# Patient Record
Sex: Male | Born: 1970 | Race: Black or African American | Hispanic: No | Marital: Married | Smoking: Current every day smoker
Health system: Southern US, Community
[De-identification: ages and names within clinical notes are randomized; demographics above are authoritative.]

## PROBLEM LIST (undated history)

## (undated) DIAGNOSIS — I509 Heart failure, unspecified: Secondary | ICD-10-CM

## (undated) DIAGNOSIS — I10 Essential (primary) hypertension: Secondary | ICD-10-CM

## (undated) DIAGNOSIS — T8859XA Other complications of anesthesia, initial encounter: Secondary | ICD-10-CM

## (undated) HISTORY — PX: CARDIAC CATHETERIZATION: SHX172

---

## 2004-06-21 ENCOUNTER — Emergency Department: Payer: Self-pay | Admitting: Emergency Medicine

## 2005-01-06 ENCOUNTER — Other Ambulatory Visit: Payer: Self-pay

## 2005-01-06 ENCOUNTER — Emergency Department: Payer: Self-pay | Admitting: Emergency Medicine

## 2007-05-24 ENCOUNTER — Emergency Department: Payer: Self-pay | Admitting: Emergency Medicine

## 2009-08-19 ENCOUNTER — Emergency Department: Payer: Self-pay | Admitting: Emergency Medicine

## 2010-07-01 ENCOUNTER — Emergency Department: Payer: Self-pay | Admitting: Emergency Medicine

## 2010-08-28 ENCOUNTER — Emergency Department: Payer: Self-pay | Admitting: Emergency Medicine

## 2011-01-16 ENCOUNTER — Emergency Department: Payer: Self-pay | Admitting: *Deleted

## 2011-06-18 ENCOUNTER — Emergency Department: Payer: Self-pay | Admitting: Internal Medicine

## 2011-06-18 LAB — CBC
HGB: 14.8 g/dL (ref 13.0–18.0)
MCH: 30.5 pg (ref 26.0–34.0)
MCHC: 33.8 g/dL (ref 32.0–36.0)
MCV: 90 fL (ref 80–100)
Platelet: 308 10*3/uL (ref 150–440)
RBC: 4.86 10*6/uL (ref 4.40–5.90)
WBC: 14.8 10*3/uL — ABNORMAL HIGH (ref 3.8–10.6)

## 2011-06-18 LAB — COMPREHENSIVE METABOLIC PANEL
Albumin: 3.4 g/dL (ref 3.4–5.0)
Alkaline Phosphatase: 110 U/L (ref 50–136)
Anion Gap: 10 (ref 7–16)
BUN: 13 mg/dL (ref 7–18)
Bilirubin,Total: 0.4 mg/dL (ref 0.2–1.0)
Calcium, Total: 9.1 mg/dL (ref 8.5–10.1)
Chloride: 101 mmol/L (ref 98–107)
Co2: 29 mmol/L (ref 21–32)
Creatinine: 1.19 mg/dL (ref 0.60–1.30)
EGFR (African American): >60
Glucose: 99 mg/dL (ref 65–99)
Osmolality: 280 (ref 275–301)
Potassium: 3.5 mmol/L (ref 3.5–5.1)
Total Protein: 8.5 g/dL — ABNORMAL HIGH (ref 6.4–8.2)

## 2011-06-18 LAB — TROPONIN I: Troponin-I: 0.05 ng/mL

## 2013-03-19 ENCOUNTER — Emergency Department: Payer: Self-pay | Admitting: Emergency Medicine

## 2013-04-24 ENCOUNTER — Emergency Department: Payer: Self-pay | Admitting: Emergency Medicine

## 2013-04-28 ENCOUNTER — Emergency Department: Payer: Self-pay | Admitting: Emergency Medicine

## 2013-05-28 ENCOUNTER — Emergency Department: Payer: Self-pay | Admitting: Emergency Medicine

## 2015-10-26 ENCOUNTER — Emergency Department: Payer: Self-pay

## 2015-10-26 ENCOUNTER — Encounter: Payer: Self-pay | Admitting: Emergency Medicine

## 2015-10-26 ENCOUNTER — Emergency Department
Admission: EM | Admit: 2015-10-26 | Discharge: 2015-10-26 | Disposition: A | Discharge status: Home / Self Care | Payer: Self-pay | Attending: Emergency Medicine | Admitting: Emergency Medicine

## 2015-10-26 DIAGNOSIS — J209 Acute bronchitis, unspecified: Secondary | ICD-10-CM | POA: Insufficient documentation

## 2015-10-26 DIAGNOSIS — F17210 Nicotine dependence, cigarettes, uncomplicated: Secondary | ICD-10-CM | POA: Insufficient documentation

## 2015-10-26 MED ORDER — PREDNISONE 10 MG PO TABS: 50.0000 mg | ORAL_TABLET | Freq: Every day | ORAL | Status: DC

## 2015-10-26 MED ORDER — AZITHROMYCIN 250 MG PO TABS: ORAL_TABLET | ORAL | Status: DC

## 2015-10-26 MED ORDER — IPRATROPIUM-ALBUTEROL 0.5-2.5 (3) MG/3ML IN SOLN
3.0000 mL | Freq: Once | RESPIRATORY_TRACT | Status: AC
  Administered 2015-10-26: 3 mL via RESPIRATORY_TRACT
  Filled 2015-10-26: qty 3

## 2015-10-26 MED ORDER — HYDROCOD POLST-CPM POLST ER 10-8 MG/5ML PO SUER
5.0000 mL | Freq: Once | ORAL | Status: AC
  Administered 2015-10-26: 5 mL via ORAL
  Filled 2015-10-26: qty 5

## 2015-10-26 MED ORDER — PREDNISONE 20 MG PO TABS
60.0000 mg | ORAL_TABLET | Freq: Once | ORAL | Status: AC
  Administered 2015-10-26: 60 mg via ORAL
  Filled 2015-10-26: qty 3

## 2015-10-26 MED ORDER — ALBUTEROL SULFATE HFA 108 (90 BASE) MCG/ACT IN AERS: 2.0000 | INHALATION_SPRAY | Freq: Four times a day (QID) | RESPIRATORY_TRACT | Status: DC | PRN

## 2015-10-26 MED ORDER — GUAIFENESIN-CODEINE 100-10 MG/5ML PO SYRP: 5.0000 mL | ORAL_SOLUTION | Freq: Three times a day (TID) | ORAL | Status: DC | PRN

## 2015-10-26 NOTE — ED Provider Notes (Signed)
[facility] Emergency Department Provider Note  ____________________________________________  Time seen: Approximately 9:11 PM  I have reviewed the triage vital signs and the nursing notes.   HISTORY  Chief Complaint Cough   HPI Michael Ross is a 45 y.o. male who presents to the emergency department for evaluation of cough and chest tightness. No fever or other symptoms.Started approximately 4 days ago. Wheezing and rattling have gotten worse. Cough is preventing sleep. No relief with Alka-Seltzer cold medicine.   History reviewed. No pertinent past medical history.  There are no active problems to display for this patient.   History reviewed. No pertinent past surgical history.  Current Outpatient Rx  Name  Route  Sig  Dispense  Refill  . albuterol (PROVENTIL HFA;VENTOLIN HFA) 108 (90 Base) MCG/ACT inhaler   Inhalation   Inhale 2 puffs into the lungs every 6 (six) hours as needed for wheezing or shortness of breath.   1 Inhaler   2   . azithromycin (ZITHROMAX) 250 MG tablet      2 tablets today, then 1 tablet for the next 4 days.   6 each   0   . guaiFENesin-codeine (ROBITUSSIN AC) 100-10 MG/5ML syrup   Oral   Take 5 mLs by mouth 3 (three) times daily as needed for cough.   120 mL   0   . predniSONE (DELTASONE) 10 MG tablet   Oral   Take 5 tablets (50 mg total) by mouth daily.   25 tablet   0     Allergies Review of patient's allergies indicates no known allergies.  History reviewed. No pertinent family history.  Social History Social History  Substance Use Topics  . Smoking status: Current Every Day Smoker -- 0.50 packs/day    Types: Cigarettes  . Smokeless tobacco: None  . Alcohol Use: None    Review of Systems Constitutional: Negative for fever/chills ENT: Negative for sore throat. Cardiovascular: Denies chest pain. Respiratory: Occasional shortness of breath. Positive for cough. Gastrointestinal: Negative for  nausea,  no vomiting.  No diarrhea.  Musculoskeletal: Give for body aches Skin: Negative for rash. Neurological: Negative for headaches ____________________________________________   PHYSICAL EXAM:  VITAL SIGNS: ED Triage Vitals  Enc Vitals Group     BP 10/26/15 2016 154/98 mmHg     Pulse Rate 10/26/15 2016 97     Resp 10/26/15 2016 18     Temp 10/26/15 2016 98.8 F (37.1 C)     Temp Source 10/26/15 2016 Oral     SpO2 10/26/15 2016 95 %     Weight 10/26/15 2016 250 lb (113.399 kg)     Height 10/26/15 2016 5\' 5"  (1.651 m)     Head Cir --      Peak Flow --      Pain Score 10/26/15 2103 7     Pain Loc --      Pain Edu? --      Excl. in [place]? --     Constitutional: Alert and oriented. Well appearing and in no acute distress. Eyes: Conjunctivae are normal. EOMI. Ears: Bilateral TM normal. Nose: No congestion; no rhinnorhea. Mouth/Throat: Mucous membranes are moist.  Oropharynx mildly erythematous. Tonsils appear normal without exudate. Neck: No stridor.  Lymphatic: No cervical lymphadenopathy. Cardiovascular: Normal rate, regular rhythm. Grossly normal heart sounds.  Good peripheral circulation. Respiratory: Normal respiratory effort.  No retractions. Faint expiratory wheeze noted in the left lower lung field, otherwise clear. Gastrointestinal: Soft and nontender.  Musculoskeletal: FROM x  4 extremities.  Neurologic:  Normal speech and language.  Skin:  Skin is warm, dry and intact. No rash noted. Psychiatric: Mood and affect are normal. Speech and behavior are normal.  ____________________________________________   LABS (all labs ordered are listed, but only abnormal results are displayed)  Labs Reviewed - No data to display ____________________________________________  EKG   ____________________________________________  RADIOLOGY  Chest x-ray negative for acute abnormality per radiology. ____________________________________________   PROCEDURES  Procedure(s)  performed: None  Critical Care performed: No  ____________________________________________   INITIAL IMPRESSION / ASSESSMENT AND PLAN / ED COURSE  Pertinent labs & imaging results that were available during my care of the patient were reviewed by me and considered in my medical decision making (see chart for details).   Patient was given a DuoNeb treatment and prednisone while in the emergency department. Prescriptions for azithromycin, prednisone, albuterol inhaler, and Robitussin-AC will be given at discharge. The patient was encouraged to schedule a follow-up appointment with primary care provider of his choice for symptoms that are not improving over the next 4-5 days. He was encouraged to return to the emergency department for symptoms that change or worsen if he is unable schedule an appointment. ____________________________________________   FINAL CLINICAL IMPRESSION(S) / ED DIAGNOSES  Final diagnoses:  Bronchitis, acute, with bronchospasm       Victorino Dike, FNP 10/26/15 2155  Hinda Kehr, MD 10/26/15 2314

## 2015-10-26 NOTE — Discharge Instructions (Signed)

## 2015-10-26 NOTE — ED Notes (Signed)
Pt presents to ED with cough x3 days, SOB with when coughing. No fever.

## 2015-10-26 NOTE — ED Notes (Addendum)
Pt. C/o cough and chest tightness. Denies fever, sore throat, sinus s/s. Pt. Presenting with chest congestion and cough. Home tx with alkaseltzer.

## 2015-11-14 ENCOUNTER — Emergency Department
Admission: EM | Admit: 2015-11-14 | Discharge: 2015-11-14 | Disposition: A | Discharge status: Home / Self Care | Payer: Self-pay | Attending: Emergency Medicine | Admitting: Emergency Medicine

## 2015-11-14 ENCOUNTER — Encounter: Payer: Self-pay | Admitting: *Deleted

## 2015-11-14 ENCOUNTER — Emergency Department: Payer: Self-pay

## 2015-11-14 DIAGNOSIS — Z792 Long term (current) use of antibiotics: Secondary | ICD-10-CM | POA: Insufficient documentation

## 2015-11-14 DIAGNOSIS — J209 Acute bronchitis, unspecified: Secondary | ICD-10-CM | POA: Insufficient documentation

## 2015-11-14 DIAGNOSIS — Z87891 Personal history of nicotine dependence: Secondary | ICD-10-CM | POA: Insufficient documentation

## 2015-11-14 DIAGNOSIS — Z79899 Other long term (current) drug therapy: Secondary | ICD-10-CM | POA: Insufficient documentation

## 2015-11-14 LAB — COMPREHENSIVE METABOLIC PANEL
ALBUMIN: 3.8 g/dL (ref 3.5–5.0)
ALT: 27 U/L (ref 17–63)
ANION GAP: 6 (ref 5–15)
AST: 27 U/L (ref 15–41)
Alkaline Phosphatase: 88 U/L (ref 38–126)
BILIRUBIN TOTAL: 0.5 mg/dL (ref 0.3–1.2)
BUN: 22 mg/dL — AB (ref 6–20)
CO2: 27 mmol/L (ref 22–32)
Calcium: 8.7 mg/dL — ABNORMAL LOW (ref 8.9–10.3)
Chloride: 106 mmol/L (ref 101–111)
Creatinine, Ser: 1.37 mg/dL — ABNORMAL HIGH (ref 0.61–1.24)
GFR calc Af Amer: >60 mL/min (ref >60)
GFR calc non Af Amer: >60 mL/min (ref >60)
GLUCOSE: 116 mg/dL — AB (ref 65–99)
POTASSIUM: 3.4 mmol/L — AB (ref 3.5–5.1)
SODIUM: 139 mmol/L (ref 135–145)
TOTAL PROTEIN: 7.7 g/dL (ref 6.5–8.1)

## 2015-11-14 LAB — CBC
HEMATOCRIT: 41.5 % (ref 40.0–52.0)
HEMOGLOBIN: 13.8 g/dL (ref 13.0–18.0)
MCH: 29.2 pg (ref 26.0–34.0)
MCHC: 33.3 g/dL (ref 32.0–36.0)
MCV: 87.7 fL (ref 80.0–100.0)
Platelets: 252 10*3/uL (ref 150–440)
RBC: 4.74 MIL/uL (ref 4.40–5.90)
RDW: 14.1 % (ref 11.5–14.5)
WBC: 16.8 10*3/uL — AB (ref 3.8–10.6)

## 2015-11-14 LAB — TROPONIN I
Troponin I: 0.04 ng/mL (ref <0.03)
Troponin I: 0.03 ng/mL (ref <0.03)

## 2015-11-14 MED ORDER — HYDROCOD POLST-CPM POLST ER 10-8 MG/5ML PO SUER
5.0000 mL | Freq: Once | ORAL | Status: AC
  Administered 2015-11-14: 5 mL via ORAL
  Filled 2015-11-14: qty 5

## 2015-11-14 MED ORDER — HYDROCHLOROTHIAZIDE 12.5 MG PO CAPS: 12.5000 mg | ORAL_CAPSULE | Freq: Every day | ORAL | Status: DC

## 2015-11-14 MED ORDER — ALBUTEROL SULFATE (2.5 MG/3ML) 0.083% IN NEBU
5.0000 mg | INHALATION_SOLUTION | Freq: Once | RESPIRATORY_TRACT | Status: AC
  Administered 2015-11-14: 5 mg via RESPIRATORY_TRACT
  Filled 2015-11-14: qty 6

## 2015-11-14 MED ORDER — ALBUTEROL SULFATE (2.5 MG/3ML) 0.083% IN NEBU
2.5000 mg | INHALATION_SOLUTION | Freq: Once | RESPIRATORY_TRACT | Status: AC
  Administered 2015-11-14: 2.5 mg via RESPIRATORY_TRACT
  Filled 2015-11-14: qty 3

## 2015-11-14 MED ORDER — PREDNISONE 20 MG PO TABS
60.0000 mg | ORAL_TABLET | Freq: Once | ORAL | Status: AC
  Administered 2015-11-14: 60 mg via ORAL
  Filled 2015-11-14: qty 3

## 2015-11-14 MED ORDER — HYDROCOD POLST-CPM POLST ER 10-8 MG/5ML PO SUER: 5.0000 mL | Freq: Two times a day (BID) | ORAL | Status: DC | PRN

## 2015-11-14 MED ORDER — PREDNISONE 10 MG PO TABS: 50.0000 mg | ORAL_TABLET | Freq: Every day | ORAL | Status: AC

## 2015-11-14 NOTE — ED Notes (Signed)
Pt provided with po fluids.

## 2015-11-14 NOTE — ED Notes (Signed)
Critical troponin of 0.04 called from paula in lab. Dr. Owens Shark notified. Pt placed on cardiac monitor, call bell at side. Pt updated on next step and possible repeat troponin draw.

## 2015-11-14 NOTE — ED Notes (Signed)
Report from teresa, rn.  

## 2015-11-14 NOTE — ED Notes (Addendum)
Pt reports he has a cough (dry nonproductive) that is causing chest pain - He states he was here a few weeks ago and got an received nebulizer at the er - He got prednisone and atb but states once he stopped taking the medication the cough returned - Pt reports being sick for over a month

## 2015-11-14 NOTE — ED Provider Notes (Signed)
[facility] Emergency Department Provider Note  ____________________________________________  Time seen: 2:30 AM  I have reviewed the triage vital signs and the nursing notes.   HISTORY  Chief Complaint Cough and Pleurisy      HPI Michael Ross is a 45 y.o. male presents with one-month history of nonproductive cough chest tightness with coughing, wheezing. Patient denies any fever no chills. Of note patient was seen on 10/26/2015 diagnoses acute bronchitis prescribe antibiotics prednisone with improvement of symptoms. Patient states after smoking excessively last week with recurrence of symptoms    Past medical history  bronchitis There are no active problems to display for this patient.   Past surgical history None  Current Outpatient Rx  Name  Route  Sig  Dispense  Refill  . albuterol (PROVENTIL HFA;VENTOLIN HFA) 108 (90 Base) MCG/ACT inhaler   Inhalation   Inhale 2 puffs into the lungs every 6 (six) hours as needed for wheezing or shortness of breath.   1 Inhaler   2   . azithromycin (ZITHROMAX) 250 MG tablet      2 tablets today, then 1 tablet for the next 4 days.   6 each   0   . guaiFENesin-codeine (ROBITUSSIN AC) 100-10 MG/5ML syrup   Oral   Take 5 mLs by mouth 3 (three) times daily as needed for cough.   120 mL   0   . predniSONE (DELTASONE) 10 MG tablet   Oral   Take 5 tablets (50 mg total) by mouth daily.   25 tablet   0     Allergies No known drug allergies No family history on file.  Social History Social History  Substance Use Topics  . Smoking status: Former Smoker -- 0.50 packs/day    Types: Cigarettes    Quit date: 11/09/2015  . Smokeless tobacco: Never Used  . Alcohol Use: Yes     Comment: 1-2 beers twice a week    Review of Systems  Constitutional: Negative for fever. Eyes: Negative for visual changes. ENT: Negative for sore throat. Cardiovascular: Negative for chest pain. Respiratory: Negative  for shortness of breath. Positive for cough and wheezing Gastrointestinal: Negative for abdominal pain, vomiting and diarrhea. Genitourinary: Negative for dysuria. Musculoskeletal: Negative for back pain. Skin: Negative for rash. Neurological: Negative for headaches, focal weakness or numbness.   10-point ROS otherwise negative.  ____________________________________________   PHYSICAL EXAM:  VITAL SIGNS: ED Triage Vitals  Enc Vitals Group     BP 11/14/15 0143 163/104 mmHg     Pulse Rate 11/14/15 0143 96     Resp 11/14/15 0143 28     Temp 11/14/15 0143 98.2 F (36.8 C)     Temp Source 11/14/15 0143 Oral     SpO2 11/14/15 0143 93 %     Weight 11/14/15 0143 245 lb (111.131 kg)     Height 11/14/15 0143 5\' 4"  (1.626 m)     Head Cir --      Peak Flow --      Pain Score 11/14/15 0144 8     Pain Loc --      Pain Edu? --      Excl. in [place]? --      Constitutional: Alert and oriented. Well appearing and in no distress. Eyes: Conjunctivae are normal. PERRL. Normal extraocular movements. ENT   Head: Normocephalic and atraumatic.   Nose: No congestion/rhinnorhea.   Mouth/Throat: Mucous membranes are moist.   Neck: No stridor. Hematological/Lymphatic/Immunilogical: No cervical lymphadenopathy. Cardiovascular: Normal  rate, regular rhythm. Normal and symmetric distal pulses are present in all extremities. No murmurs, rubs, or gallops. Respiratory: Normal respiratory effort without tachypnea nor retractions. Breath sounds are clear and equal bilaterally. No wheezes/rales/rhonchi. Gastrointestinal: Soft and nontender. No distention. There is no CVA tenderness. Genitourinary: deferred Musculoskeletal: Nontender with normal range of motion in all extremities. No joint effusions.  No lower extremity tenderness nor edema. Neurologic:  Normal speech and language. No gross focal neurologic deficits are appreciated. Speech is normal.  Skin:  Skin is warm, dry and intact. No rash  noted. Psychiatric: Mood and affect are normal. Speech and behavior are normal. Patient exhibits appropriate insight and judgment.  ____________________________________________    LABS (pertinent positives/negatives)  Labs Reviewed  CBC - Abnormal; Notable for the following:    WBC 16.8 (*)    All other components within normal limits  COMPREHENSIVE METABOLIC PANEL  TROPONIN I     ____________________________________________   EKG  ED ECG REPORT I, [place] N BROWN, the attending physician, personally viewed and interpreted this ECG.   Date: 11/14/2015  EKG Time: 1:44 AM  Rate: 101  Rhythm: Sinus tachycardia  Axis: Normal  Intervals: Normal  ST&T Change: None     RADIOLOGY  DG Chest 2 View (Final result) Result time: 11/14/15 02:10:15   Final result by Rad Results In Interface (11/14/15 02:10:15)   Narrative:   CLINICAL DATA: Nonproductive cough and non radiating chest pain for 1 month.  EXAM: CHEST 2 VIEW  COMPARISON: 10/26/2015  FINDINGS: Unchanged mild cardiomegaly. The lungs are clear. The pulmonary vasculature is normal. There is no pleural effusion. Hilar and mediastinal contours are unremarkable and unchanged.  IMPRESSION: Stable mild cardiomegaly. No acute findings.   Electronically Signed By: Andreas Newport M.D. On: 11/14/2015 02:10         Procedures    INITIAL IMPRESSION / ASSESSMENT AND PLAN / ED COURSE  Pertinent labs & imaging results that were available during my care of the patient were reviewed by me and considered in my medical decision making (see chart for details).    ____________________________________________   FINAL CLINICAL IMPRESSION(S) / ED DIAGNOSES  Final diagnoses:  Acute bronchitis, unspecified organism      Gregor Hams, MD 11/14/15 [phone]

## 2015-11-14 NOTE — ED Notes (Signed)
Pt c/o persistent, non-productive cough and non-radiating chest pain w/ cough. Pt states he feels as if he is still wheezing. Pt denies n/v and fever at this time. Pt states recently treated for cough.

## 2015-11-14 NOTE — Discharge Instructions (Signed)

## 2015-11-25 ENCOUNTER — Emergency Department
Admission: EM | Admit: 2015-11-25 | Discharge: 2015-11-25 | Disposition: A | Discharge status: Home / Self Care | Payer: Self-pay | Attending: Emergency Medicine | Admitting: Emergency Medicine

## 2015-11-25 ENCOUNTER — Emergency Department: Payer: Self-pay

## 2015-11-25 DIAGNOSIS — F17210 Nicotine dependence, cigarettes, uncomplicated: Secondary | ICD-10-CM | POA: Insufficient documentation

## 2015-11-25 DIAGNOSIS — Z79899 Other long term (current) drug therapy: Secondary | ICD-10-CM | POA: Insufficient documentation

## 2015-11-25 DIAGNOSIS — J42 Unspecified chronic bronchitis: Secondary | ICD-10-CM | POA: Insufficient documentation

## 2015-11-25 DIAGNOSIS — I10 Essential (primary) hypertension: Secondary | ICD-10-CM | POA: Insufficient documentation

## 2015-11-25 HISTORY — DX: Essential (primary) hypertension: I10

## 2015-11-25 LAB — CBC
HCT: 41.1 % (ref 40.0–52.0)
Hemoglobin: 13.9 g/dL (ref 13.0–18.0)
MCH: 29.4 pg (ref 26.0–34.0)
MCHC: 33.7 g/dL (ref 32.0–36.0)
MCV: 87.2 fL (ref 80.0–100.0)
Platelets: 248 10*3/uL (ref 150–440)
RBC: 4.71 MIL/uL (ref 4.40–5.90)
RDW: 14.3 % (ref 11.5–14.5)
WBC: 13.7 10*3/uL — ABNORMAL HIGH (ref 3.8–10.6)

## 2015-11-25 LAB — BASIC METABOLIC PANEL
Anion gap: 8 (ref 5–15)
BUN: 21 mg/dL — ABNORMAL HIGH (ref 6–20)
CALCIUM: 8.9 mg/dL (ref 8.9–10.3)
CO2: 28 mmol/L (ref 22–32)
CREATININE: 1.4 mg/dL — AB (ref 0.61–1.24)
Chloride: 105 mmol/L (ref 101–111)
GLUCOSE: 141 mg/dL — AB (ref 65–99)
Potassium: 3.1 mmol/L — ABNORMAL LOW (ref 3.5–5.1)
Sodium: 141 mmol/L (ref 135–145)

## 2015-11-25 LAB — TROPONIN I: TROPONIN I: 0.04 ng/mL — AB (ref <0.03)

## 2015-11-25 MED ORDER — AZITHROMYCIN 500 MG PO TABS
500.0000 mg | ORAL_TABLET | Freq: Once | ORAL | Status: AC
  Administered 2015-11-25: 500 mg via ORAL
  Filled 2015-11-25: qty 1

## 2015-11-25 MED ORDER — HYDROCOD POLST-CPM POLST ER 10-8 MG/5ML PO SUER: 5.0000 mL | Freq: Two times a day (BID) | ORAL | Status: DC | PRN

## 2015-11-25 MED ORDER — ALBUTEROL SULFATE HFA 108 (90 BASE) MCG/ACT IN AERS: 2.0000 | INHALATION_SPRAY | RESPIRATORY_TRACT | Status: DC | PRN

## 2015-11-25 MED ORDER — ALBUTEROL SULFATE (2.5 MG/3ML) 0.083% IN NEBU
INHALATION_SOLUTION | RESPIRATORY_TRACT | Status: AC
  Administered 2015-11-25: 2.5 mg
  Filled 2015-11-25: qty 3

## 2015-11-25 MED ORDER — AZITHROMYCIN 500 MG PO TABS: 500.0000 mg | ORAL_TABLET | Freq: Every day | ORAL | Status: AC

## 2015-11-25 MED ORDER — PREDNISONE 20 MG PO TABS: 60.0000 mg | ORAL_TABLET | Freq: Every day | ORAL | Status: AC

## 2015-11-25 MED ORDER — METHYLPREDNISOLONE SODIUM SUCC 125 MG IJ SOLR
125.0000 mg | Freq: Once | INTRAMUSCULAR | Status: AC
  Administered 2015-11-25: 125 mg via INTRAVENOUS
  Filled 2015-11-25: qty 2

## 2015-11-25 MED ORDER — ALBUTEROL SULFATE (2.5 MG/3ML) 0.083% IN NEBU
5.0000 mg | INHALATION_SOLUTION | Freq: Once | RESPIRATORY_TRACT | Status: AC
  Administered 2015-11-25: 5 mg via RESPIRATORY_TRACT
  Filled 2015-11-25: qty 6

## 2015-11-25 NOTE — ED Notes (Signed)
Pt came in by EMS, reports that he is starting to smoke again.  O2 was 88% upon arrival of EMS.  Pt received 2 duonebs and albuterol in route.  Pt reports feeling somewhat better, but still visibly tachypneic.  Pt diaphoretic as well.  Pt coughing up yellow sputum.

## 2015-11-25 NOTE — ED Notes (Signed)
Pt breathing not labored at this time.  Pt states he feels better at this time.  O2 back down to 89% without oxygen.

## 2015-11-25 NOTE — ED Provider Notes (Signed)
[facility] Emergency Department Provider Note  ____________________________________________  Time seen: 5:40 AM  I have reviewed the triage vital signs and the nursing notes.   HISTORY  Chief Complaint Shortness of Breath     HPI Michael Ross is a 45 y.o. male presents with productive cough shortness of breath 3 days. Patient denies any fever afebrile on presentation temperature of 98.6. Of note patient was seen on 11/14/2015 for same. He states following this visit he felt much better with prednisone antibiotic therapy and antitussive however he stated that "when I started feeling better I started smoking again". Patient denies any chest pain. Patient received 2 DuoNeb's and EMS and states that his breathing is improved     Past Medical History  Diagnosis Date  . Hypertension     There are no active problems to display for this patient.   History reviewed. No pertinent past surgical history.  Current Outpatient Rx  Name  Route  Sig  Dispense  Refill  . albuterol (PROVENTIL HFA;VENTOLIN HFA) 108 (90 Base) MCG/ACT inhaler   Inhalation   Inhale 2 puffs into the lungs every 6 (six) hours as needed for wheezing or shortness of breath.   1 Inhaler   2   . chlorpheniramine-HYDROcodone (TUSSIONEX) 10-8 MG/5ML SUER   Oral   Take 5 mLs by mouth every 12 (twelve) hours as needed for cough.   140 mL   0   . hydrochlorothiazide (MICROZIDE) 12.5 MG capsule   Oral   Take 1 capsule (12.5 mg total) by mouth daily.   30 capsule   0     Allergies No known drug allergies No family history on file.  Social History Social History  Substance Use Topics  . Smoking status: Current Every Day Smoker -- 0.50 packs/day    Types: Cigarettes    Last Attempt to Quit: 11/09/2015  . Smokeless tobacco: Never Used  . Alcohol Use: Yes     Comment: 1-2 beers twice a week    Review of Systems  Constitutional: Negative for fever. Eyes: Negative for visual  changes. ENT: Negative for sore throat. Cardiovascular: Negative for chest pain. Respiratory: Positive for shortness of breath. Gastrointestinal: Negative for abdominal pain, vomiting and diarrhea. Genitourinary: Negative for dysuria. Musculoskeletal: Negative for back pain. Skin: Negative for rash. Neurological: Negative for headaches, focal weakness or numbness.   10-point ROS otherwise negative.  ____________________________________________   PHYSICAL EXAM:  VITAL SIGNS: ED Triage Vitals  Enc Vitals Group     BP 11/25/15 0540 166/101 mmHg     Pulse Rate 11/25/15 0540 120     Resp 11/25/15 0540 22     Temp 11/25/15 0540 98.6 F (37 C)     Temp Source 11/25/15 0540 Oral     SpO2 11/25/15 0540 88 %     Weight 11/25/15 0540 250 lb (113.399 kg)     Height 11/25/15 0540 5\' 5"  (1.651 m)     Head Cir --      Peak Flow --      Pain Score 11/25/15 0541 8     Pain Loc --      Pain Edu? --      Excl. in [place]? --     Constitutional: Alert and oriented. Apparent respiratory distress Eyes: Conjunctivae are normal. PERRL. Normal extraocular movements. ENT   Head: Normocephalic and atraumatic.   Nose: No congestion/rhinnorhea.   Mouth/Throat: Mucous membranes are moist.   Neck: No stridor. Hematological/Lymphatic/Immunilogical: No cervical lymphadenopathy.  Cardiovascular: Normal rate, regular rhythm. Normal and symmetric distal pulses are present in all extremities. No murmurs, rubs, or gallops. Respiratory: Tachypnea, positive accessory respiratory muscle use diffuse expiratory wheezes Gastrointestinal: Soft and nontender. No distention. There is no CVA tenderness. Genitourinary: deferred Musculoskeletal: Nontender with normal range of motion in all extremities. No joint effusions.  No lower extremity tenderness nor edema. Neurologic:  Normal speech and language. No gross focal neurologic deficits are appreciated. Speech is normal.  Skin:  Skin is warm, dry and intact.  No rash noted. Psychiatric: Mood and affect are normal. Speech and behavior are normal. Patient exhibits appropriate insight and judgment.  ____________________________________________    LABS (pertinent positives/negatives)  Labs Reviewed  BASIC METABOLIC PANEL - Abnormal; Notable for the following:    Potassium 3.1 (*)    Glucose, Bld 141 (*)    BUN 21 (*)    Creatinine, Ser 1.40 (*)    All other components within normal limits  CBC - Abnormal; Notable for the following:    WBC 13.7 (*)    All other components within normal limits  TROPONIN I - Abnormal; Notable for the following:    Troponin I 0.04 (*)    All other components within normal limits     ____________________________________________   EKG  ED ECG REPORT I, [place] N BROWN, the attending physician, personally viewed and interpreted this ECG.   Date: 11/25/2015  EKG Time: 5:36 AM  Rate: 115  Rhythm: Sinus tachycardia  Axis: Normal  Intervals: Normal  ST&T Change: None   ____________________________________________    RADIOLOGY  DG Chest Port 1 View (Final result) Result time: 11/25/15 06:39:30   Final result by Rad Results In Interface (11/25/15 06:39:30)   Narrative:   CLINICAL DATA: SOB and cough. Just finished breathing treatment before portable. SOB x 2 weeks, hasn't gotten better since seen last time. Pt stated no prev surg or any conditions. Shielded.  EXAM: PORTABLE CHEST 1 VIEW  COMPARISON: 11/14/2015  FINDINGS: Borderline heart size without vascular congestion. No edema or consolidation. No blunting of costophrenic angles. No pneumothorax. Mediastinal contours appear intact.  IMPRESSION: No active disease.   Electronically Signed By: Lucienne Capers M.D. On: 11/25/2015 06:39    Procedures     INITIAL IMPRESSION / ASSESSMENT AND PLAN / ED COURSE  Pertinent labs & imaging results that were available during my care of the patient were reviewed by me and  considered in my medical decision making (see chart for details). Patient received albuterol 5 mg nebulized as well as Solu-Medrol 125 mg on presentation to emergency departmentWith movement of symptoms  ____________________________________________   FINAL CLINICAL IMPRESSION(S) / ED DIAGNOSES  Final diagnoses:  Chronic bronchitis, unspecified chronic bronchitis type ([place])      Gregor Hams, MD 11/25/15 [phone]

## 2015-11-25 NOTE — ED Notes (Signed)
Notified MD of critical troponin.  MD aware.

## 2016-02-12 ENCOUNTER — Emergency Department: Payer: Self-pay

## 2016-02-12 ENCOUNTER — Emergency Department
Admission: EM | Admit: 2016-02-12 | Discharge: 2016-02-12 | Disposition: A | Discharge status: Home / Self Care | Payer: Self-pay | Attending: Emergency Medicine | Admitting: Emergency Medicine

## 2016-02-12 DIAGNOSIS — R05 Cough: Secondary | ICD-10-CM

## 2016-02-12 DIAGNOSIS — R053 Chronic cough: Secondary | ICD-10-CM

## 2016-02-12 DIAGNOSIS — K219 Gastro-esophageal reflux disease without esophagitis: Secondary | ICD-10-CM | POA: Insufficient documentation

## 2016-02-12 DIAGNOSIS — Z79899 Other long term (current) drug therapy: Secondary | ICD-10-CM | POA: Insufficient documentation

## 2016-02-12 DIAGNOSIS — I10 Essential (primary) hypertension: Secondary | ICD-10-CM | POA: Insufficient documentation

## 2016-02-12 DIAGNOSIS — F17210 Nicotine dependence, cigarettes, uncomplicated: Secondary | ICD-10-CM | POA: Insufficient documentation

## 2016-02-12 MED ORDER — FAMOTIDINE 20 MG PO TABS: 20.0000 mg | ORAL_TABLET | Freq: Two times a day (BID) | ORAL | 1 refills | Status: DC

## 2016-02-12 MED ORDER — FAMOTIDINE 20 MG PO TABS
20.0000 mg | ORAL_TABLET | Freq: Once | ORAL | Status: AC
  Administered 2016-02-12: 20 mg via ORAL
  Filled 2016-02-12: qty 1

## 2016-02-12 NOTE — Discharge Instructions (Signed)
Make an appointment with Dr. Vira Agar to be seen for your chronic cough and further evaluation of reflux. Pepcid twice a day.

## 2016-02-12 NOTE — ED Notes (Signed)
Reviewed d/c instructions, follow-up care, and prescription with pt. Pt verbalized understanding 

## 2016-02-12 NOTE — ED Triage Notes (Signed)
Patient reports having a cough for "months".  States he has been seen in the ED several times for the same and it never gets any better.

## 2016-02-12 NOTE — ED Provider Notes (Signed)
ED ECG REPORT I, Doran Stabler, the attending physician, personally viewed and interpreted this ECG.   Date: 02/12/2016  EKG Time:  2308  Rate: 91  Rhythm: sinus rhythm  Axis: normal  Intervals:mildly prolonged  ST&T Change: t wave inversions in II, III and AVF, v4-6 that are unchanged from previous ekg of 11/25/15    Orbie Pyo, MD 02/12/16 2330

## 2016-02-12 NOTE — ED Notes (Signed)
Pt c/o cough x 2 months, dyspnea on exertion

## 2016-02-12 NOTE — ED Provider Notes (Signed)
[facility] Emergency Department Provider Note  ____________________________________________   First MD Initiated Contact with Patient 02/12/16 2225     (approximate)  I have reviewed the triage vital signs and the nursing notes.   HISTORY  Chief Complaint Cough   HPI Jeru Cusmano is a 45 y.o. male is here with complaint of cough for approximately 2-3 months. Patient states that he has been to the emergency room several times and given breathing treatments but states the cough comes back.Patient denies any fever or chills. He denies any productive cough. He states that he discontinued smoking approximately 2 months ago. Currently is not taking any medication for cough. He states that he has been aware of some indigestion and reflux symptoms. Currently he rates his pain as an 8 out of 10.   Past Medical History:  Diagnosis Date  . Hypertension     There are no active problems to display for this patient.   No past surgical history on file.  Prior to Admission medications   Medication Sig Start Date End Date Taking? Authorizing Provider  albuterol (PROVENTIL HFA;VENTOLIN HFA) 108 (90 Base) MCG/ACT inhaler Inhale 2 puffs into the lungs every 4 (four) hours as needed for wheezing or shortness of breath. 11/25/15   Gregor Hams, MD  chlorpheniramine-HYDROcodone (TUSSIONEX) 10-8 MG/5ML SUER Take 5 mLs by mouth every 12 (twelve) hours as needed for cough. 11/25/15   Gregor Hams, MD  famotidine (PEPCID) 20 MG tablet Take 1 tablet (20 mg total) by mouth 2 (two) times daily. 02/12/16 02/11/17  Johnn Hai, PA-C  hydrochlorothiazide (MICROZIDE) 12.5 MG capsule Take 1 capsule (12.5 mg total) by mouth daily. 11/14/15 11/13/16  Gregor Hams, MD    Allergies Review of patient's allergies indicates no known allergies.  No family history on file.  Social History Social History  Substance Use Topics  . Smoking status: Current Every Day Smoker   Packs/day: 0.50    Types: Cigarettes    Last attempt to quit: 11/09/2015  . Smokeless tobacco: Never Used  . Alcohol use Yes     Comment: 1-2 beers twice a week    Review of Systems Constitutional: No fever/chills Eyes: No visual changes. ENT: No sore throat. Cardiovascular: Denies chest pain. Respiratory: Describes some dyspnea on exertion. Positive nonproductive cough 2 months. Gastrointestinal: No abdominal pain.  No nausea, no vomiting.  Positive indigestion and reflux symptoms. Musculoskeletal: Negative for back pain. Neurological: Negative for headaches, focal weakness or numbness.  10-point ROS otherwise negative.  ____________________________________________   PHYSICAL EXAM:  VITAL SIGNS: ED Triage Vitals  Enc Vitals Group     BP 02/12/16 2123 (!) 166/88     Pulse Rate 02/12/16 2123 93     Resp 02/12/16 2123 20     Temp 02/12/16 2123 98.7 F (37.1 C)     Temp Source 02/12/16 2123 Oral     SpO2 02/12/16 2123 95 %     Weight 02/12/16 2122 250 lb (113.4 kg)     Height 02/12/16 2122 5\' 5"  (1.651 m)     Head Circumference --      Peak Flow --      Pain Score 02/12/16 2147 8     Pain Loc --      Pain Edu? --      Excl. in [place]? --     Constitutional: Alert and oriented. Well appearing and in no acute distress. Eyes: Conjunctivae are normal. PERRL. EOMI. Head: Atraumatic. Nose:  No congestion/rhinnorhea. Mouth/Throat: Mucous membranes are moist.  Oropharynx non-erythematous.No posterior drainage seen. Neck: No stridor.   Hematological/Lymphatic/Immunilogical: No cervical lymphadenopathy. Cardiovascular: Normal rate, regular rhythm. Grossly normal heart sounds.  Good peripheral circulation. Respiratory: Normal respiratory effort.  No retractions. Lungs CTAB. Gastrointestinal: Soft and nontender. No distention. Bowel sounds normoactive 4 quadrants. Musculoskeletal: No lower extremity tenderness nor edema.  No joint effusions. Neurologic:  Normal speech and  language. No gross focal neurologic deficits are appreciated. No gait instability. Skin:  Skin is warm, dry and intact. No rash noted. Psychiatric: Mood and affect are normal. Speech and behavior are normal.  ____________________________________________   LABS (all labs ordered are listed, but only abnormal results are displayed)  Labs Reviewed - No data to display ____________________________________________  EKG  Per Dr. Dineen Kid  RADIOLOGY Chest x-ray per radiologist is negative for cardiopulmonary disease. I, Johnn Hai, personally viewed and evaluated these images (plain radiographs) as part of my medical decision making, as well as reviewing the written report by the radiologist.  ____________________________________________   PROCEDURES  Procedure(s) performed: None  Procedures  Critical Care performed: No  ____________________________________________   INITIAL IMPRESSION / ASSESSMENT AND PLAN / ED COURSE  Pertinent labs & imaging results that were available during my care of the patient were reviewed by me and considered in my medical decision making (see chart for details).    Clinical Course   Patient had slowly no wheezing or difficulty in the emergency room. He was able talk in complete sentences without any difficulty. Patient and his male friend were in the room and at times patient was lying on his stomach without any difficulty. We discussed possibility that his chronic cough could be coming from reflux symptoms. He was encouraged to follow-up with Dr. Vira Agar for further evaluation. Patient was given Pepcid quality in the emergency room and also given a prescription for the same to begin taking at home.  ____________________________________________   FINAL CLINICAL IMPRESSION(S) / ED DIAGNOSES  Final diagnoses:  Chronic cough  Gastric reflux      NEW MEDICATIONS STARTED DURING THIS VISIT:  Discharge Medication List as of 02/12/2016  11:38 PM    START taking these medications   Details  famotidine (PEPCID) 20 MG tablet Take 1 tablet (20 mg total) by mouth 2 (two) times daily., Starting Fri 02/12/2016, Until Sat 02/11/2017, Print         Note:  This document was prepared using Dragon voice recognition software and may include unintentional dictation errors.    Johnn Hai, PA-C 02/13/16 0012    Orbie Pyo, MD 02/13/16 [phone]

## 2016-04-13 HISTORY — PX: CARDIAC CATHETERIZATION: SHX172

## 2016-04-21 ENCOUNTER — Emergency Department
Admission: EM | Admit: 2016-04-21 | Discharge: 2016-04-21 | Disposition: A | Discharge status: Home / Self Care | Payer: Self-pay | Attending: Emergency Medicine | Admitting: Emergency Medicine

## 2016-04-21 DIAGNOSIS — Z79899 Other long term (current) drug therapy: Secondary | ICD-10-CM | POA: Insufficient documentation

## 2016-04-21 DIAGNOSIS — T783XXA Angioneurotic edema, initial encounter: Secondary | ICD-10-CM | POA: Insufficient documentation

## 2016-04-21 DIAGNOSIS — I110 Hypertensive heart disease with heart failure: Secondary | ICD-10-CM | POA: Insufficient documentation

## 2016-04-21 DIAGNOSIS — I509 Heart failure, unspecified: Secondary | ICD-10-CM | POA: Insufficient documentation

## 2016-04-21 DIAGNOSIS — F17210 Nicotine dependence, cigarettes, uncomplicated: Secondary | ICD-10-CM | POA: Insufficient documentation

## 2016-04-21 HISTORY — DX: Heart failure, unspecified: I50.9

## 2016-04-21 MED ORDER — METHYLPREDNISOLONE SODIUM SUCC 125 MG IJ SOLR
125.0000 mg | Freq: Once | INTRAMUSCULAR | Status: AC
  Administered 2016-04-21: 125 mg via INTRAVENOUS
  Filled 2016-04-21: qty 2

## 2016-04-21 MED ORDER — PREDNISONE 10 MG PO TABS: ORAL_TABLET | ORAL | 0 refills | Status: DC

## 2016-04-21 MED ORDER — DIPHENHYDRAMINE HCL 50 MG/ML IJ SOLN
25.0000 mg | Freq: Once | INTRAMUSCULAR | Status: AC
  Administered 2016-04-21: 25 mg via INTRAVENOUS
  Filled 2016-04-21: qty 1

## 2016-04-21 MED ORDER — FAMOTIDINE IN NACL 20-0.9 MG/50ML-% IV SOLN
20.0000 mg | Freq: Once | INTRAVENOUS | Status: AC
  Administered 2016-04-21: 20 mg via INTRAVENOUS
  Filled 2016-04-21: qty 50

## 2016-04-21 NOTE — ED Provider Notes (Signed)
[facility] Emergency Department Provider Note ____________________________________________   I have reviewed the triage vital signs and the triage nursing note.  HISTORY  Chief Complaint Angioedema   Historian Patient  HPI Michael Ross is a 45 y.o. male recently diagnosed with CHF after ED and [facility] hospital admission - discharged on lisinopril, presents today with lower lip swelling noted at 3am.  No tongue or throat swelling.  No trouble breathing, no trouble swallowing.  No skin rash.  No other known allergies.  Symptoms mild.  Nothing makes it worse or better.    Past Medical History:  Diagnosis Date  . CHF (congestive heart failure) ([place])   . Hypertension     There are no active problems to display for this patient.   History reviewed. No pertinent surgical history.  Prior to Admission medications   Medication Sig Start Date End Date Taking? Authorizing Provider  atorvastatin (LIPITOR) 10 MG tablet Take 10 mg by mouth daily. 04/15/16 05/15/16 Yes Historical Provider, MD  furosemide (LASIX) 40 MG tablet Take 40 mg by mouth daily. 04/16/16 05/16/16 Yes Historical Provider, MD  albuterol (PROVENTIL HFA;VENTOLIN HFA) 108 (90 Base) MCG/ACT inhaler Inhale 2 puffs into the lungs every 4 (four) hours as needed for wheezing or shortness of breath. Patient not taking: Reported on 04/21/2016 11/25/15   Gregor Hams, MD  chlorpheniramine-HYDROcodone (TUSSIONEX) 10-8 MG/5ML SUER Take 5 mLs by mouth every 12 (twelve) hours as needed for cough. Patient not taking: Reported on 04/21/2016 11/25/15   Gregor Hams, MD  famotidine (PEPCID) 20 MG tablet Take 1 tablet (20 mg total) by mouth 2 (two) times daily. Patient not taking: Reported on 04/21/2016 02/12/16 02/11/17  Johnn Hai, PA-C  hydrochlorothiazide (MICROZIDE) 12.5 MG capsule Take 1 capsule (12.5 mg total) by mouth daily. Patient not taking: Reported on 04/21/2016 11/14/15 11/13/16  Gregor Hams, MD   lisinopril (PRINIVIL,ZESTRIL) 10 MG tablet Take 10 mg by mouth daily. 04/16/16 05/16/16  Historical Provider, MD  predniSONE (DELTASONE) 10 MG tablet 50mg  by mouth for 4 days 04/21/16   Lisa Roca, MD    Allergies  Allergen Reactions  . Lisinopril Swelling    angioedema    No family history on file.  Social History Social History  Substance Use Topics  . Smoking status: Current Every Day Smoker    Packs/day: 0.50    Types: Cigarettes    Last attempt to quit: 11/09/2015  . Smokeless tobacco: Never Used  . Alcohol use Yes     Comment: 1-2 beers twice a week    Review of Systems  Constitutional: Negative for fever. Eyes: Negative for visual changes. ENT: Negative for sore throat. Cardiovascular: Negative for chest pain. Respiratory: Negative for shortness of breath. Gastrointestinal: Negative for vomiting Genitourinary:  Musculoskeletal:  Skin: Negative for rash. Neurological: Negative for headache. 10 point Review of Systems otherwise negative ____________________________________________   PHYSICAL EXAM:  VITAL SIGNS: ED Triage Vitals [04/21/16 0924]  Enc Vitals Group     BP 139/78     Pulse Rate 84     Resp 18     Temp 98 F (36.7 C)     Temp Source Oral     SpO2 96 %     Weight 257 lb (116.6 kg)     Height 5\' 6"  (1.676 m)     Head Circumference      Peak Flow      Pain Score      Pain Loc  Pain Edu?      Excl. in [place]?      Constitutional: Alert and oriented. Well appearing and in no distress. HEENT   Head: Normocephalic and atraumatic.      Eyes: Conjunctivae are normal. PERRL. Normal extraocular movements.      Ears:         Nose: No congestion/rhinnorhea.   Mouth/Throat: Mucous membranes are moist.  Normal oropharynx and tongue.  Moderately swollen entire lower lip.   Neck: No stridor. Cardiovascular/Chest: Normal rate, regular rhythm.  No murmurs, rubs, or gallops. Respiratory: Normal respiratory effort without tachypnea nor  retractions. Breath sounds are clear and equal bilaterally. No wheezes/rales/rhonchi. Gastrointestinal: Soft. No distention, no guarding, no rebound. Nontender.    Genitourinary/rectal:Deferred Musculoskeletal: Nontender with normal range of motion in all extremities. No joint effusions.  No lower extremity tenderness.  No edema. Neurologic:  Normal speech and language. No gross or focal neurologic deficits are appreciated. Skin:  Skin is warm, dry and intact. No rash noted. Psychiatric: Mood and affect are normal. Speech and behavior are normal. Patient exhibits appropriate insight and judgment.   ____________________________________________  LABS (pertinent positives/negatives)  Labs Reviewed - No data to display  ____________________________________________    EKG I, Lisa Roca, MD, the attending physician have personally viewed and interpreted all ECGs.  None ____________________________________________  RADIOLOGY All Xrays were viewed by me. Imaging interpreted by Radiologist.  None __________________________________________  PROCEDURES  Procedure(s) performed: None  Critical Care performed: None  ____________________________________________   ED COURSE / ASSESSMENT AND PLAN  Pertinent labs & imaging results that were available during my care of the patient were reviewed by me and considered in my medical decision making (see chart for details).   Angioedema of the lower lip without involvement of the tongue or throat.  No other allergic symptoms. Presumed etiology ACE inhibitor use, started lisinopril about a week ago.  Discussed unclear benefit to adjunctive allergic treatments, but chose to go ahead and add solumedrol, benadryl and pepcid -- of course stop/avoid lisinopril.   Emesis x one after IV doses -- given dose of zofran.  I don't think he has symptoms of anaphylaxis in terms of need for epi.  Will monitor.  Will watch for several hours to ensure no  significant worsening.  Reexamination at 1:30, improved swelling, less tense.  No new swelling of throat or mouth.  Discussed plan with patient.  Will see his doctor as scheduled tomorrow for discussion of BP control.    CONSULTATIONS:   None  Patient / Family / Caregiver informed of clinical course, medical decision-making process, and agree with plan.   I discussed return precautions, follow-up instructions, and discharge instructions with patient and/or family.   ___________________________________________   FINAL CLINICAL IMPRESSION(S) / ED DIAGNOSES   Final diagnoses:  Angioedema of lips, initial encounter              Note: This dictation was prepared with Dragon dictation. Any transcriptional errors that result from this process are unintentional    Lisa Roca, MD 04/21/16 1331

## 2016-04-21 NOTE — Discharge Instructions (Signed)
You were evaluated for swelling of the lower lip, called angioedema which is likely due to the medication lisinopril. Stop and do not take any more lisinopril. List lisinopril as an allergy causing angioedema/swelling.  Several medications might help the swelling resolve, this includes prescribed prednisone, as well as to over-the-counter medications. You may take Benadryl 25 mg over-the-counter every 4-6 hours for swelling.  You may take over-the-counter Zantac 150 mg tablet once daily for 4 more days.  As discussed, although unlikely, it is always possible that things would worsen instead of better. If you have any worsening swelling, certainly any swelling of the tongue or throat or trouble swallowing or trouble breathing return to the emergency department immediately.  Discussed with your primary doctor at the scheduled appointment tomorrow, a medication for blood pressure to replace the lisinopril which we have stopped.

## 2016-04-21 NOTE — ED Triage Notes (Signed)
Pt c/o waking with swelling to lower lip this morning, prescribed lisinopril a week ago.. No difficulty speaking or swallowing.

## 2017-04-27 ENCOUNTER — Encounter: Payer: Self-pay | Admitting: Emergency Medicine

## 2017-04-27 ENCOUNTER — Emergency Department
Admission: EM | Admit: 2017-04-27 | Discharge: 2017-04-27 | Disposition: A | Discharge status: Home / Self Care | Payer: Self-pay | Attending: Emergency Medicine | Admitting: Emergency Medicine

## 2017-04-27 DIAGNOSIS — K0889 Other specified disorders of teeth and supporting structures: Secondary | ICD-10-CM | POA: Insufficient documentation

## 2017-04-27 DIAGNOSIS — Z79899 Other long term (current) drug therapy: Secondary | ICD-10-CM | POA: Insufficient documentation

## 2017-04-27 DIAGNOSIS — I509 Heart failure, unspecified: Secondary | ICD-10-CM | POA: Insufficient documentation

## 2017-04-27 DIAGNOSIS — I110 Hypertensive heart disease with heart failure: Secondary | ICD-10-CM | POA: Insufficient documentation

## 2017-04-27 DIAGNOSIS — F17210 Nicotine dependence, cigarettes, uncomplicated: Secondary | ICD-10-CM | POA: Insufficient documentation

## 2017-04-27 MED ORDER — LIDOCAINE-EPINEPHRINE 2 %-1:100000 IJ SOLN
1.7000 mL | Freq: Once | INTRAMUSCULAR | Status: AC
  Administered 2017-04-27: 1.7 mL
  Filled 2017-04-27: qty 1.7

## 2017-04-27 MED ORDER — HYDROCODONE-ACETAMINOPHEN 5-325 MG PO TABS: 1.0000 | ORAL_TABLET | ORAL | 0 refills | Status: DC | PRN

## 2017-04-27 NOTE — Discharge Instructions (Signed)
Please call and schedule a dental appointment as soon as possible. You will need to be seen within the next 14 days. Return to the emergency department for symptoms that change or worsen if you're unable to schedule an appointment. ° °

## 2017-04-27 NOTE — ED Notes (Signed)
Patient up to stat desk requesting work note. Work note provided.

## 2017-04-27 NOTE — ED Provider Notes (Signed)
[facility] Emergency Department Provider Note ____________________________________________  Time seen: Approximately 8:04 PM  I have reviewed the triage vital signs and the nursing notes.   HISTORY  Chief Complaint Dental Pain   HPI Michael Ross is a 46 y.o. male who presents to the emergency department for evaluation and treatment of dental pain.  He states that he was evaluated by dentist today who told him that he has an impacted wisdom tooth and will need to see an oral surgeon.  He states that he attempted to schedule an appointment, but they will require $500 in order for him to be seen and he will not have that until after the first of the year.  He states that he did get his antibiotic prescription filled that was written by the dentist, but there was no pain medication prescribed.  He states that this afternoon the pain began to get much worse and he is no longer able to tolerate the pain. Past Medical History:  Diagnosis Date  . CHF (congestive heart failure) ([place])   . Hypertension     There are no active problems to display for this patient.   Past Surgical History:  Procedure Laterality Date  . CARDIAC CATHETERIZATION      Prior to Admission medications   Medication Sig Start Date End Date Taking? Authorizing Provider  albuterol (PROVENTIL HFA;VENTOLIN HFA) 108 (90 Base) MCG/ACT inhaler Inhale 2 puffs into the lungs every 4 (four) hours as needed for wheezing or shortness of breath. Patient not taking: Reported on 04/21/2016 11/25/15   Gregor Hams, MD  atorvastatin (LIPITOR) 10 MG tablet Take 10 mg by mouth daily. 04/15/16 05/15/16  [provider]  chlorpheniramine-HYDROcodone (TUSSIONEX) 10-8 MG/5ML SUER Take 5 mLs by mouth every 12 (twelve) hours as needed for cough. Patient not taking: Reported on 04/21/2016 11/25/15   Gregor Hams, MD  famotidine (PEPCID) 20 MG tablet Take 1 tablet (20 mg total) by mouth 2 (two) times  daily. Patient not taking: Reported on 04/21/2016 02/12/16 02/11/17  Johnn Hai, PA-C  furosemide (LASIX) 40 MG tablet Take 40 mg by mouth daily. 04/16/16 05/16/16  [provider]  hydrochlorothiazide (MICROZIDE) 12.5 MG capsule Take 1 capsule (12.5 mg total) by mouth daily. Patient not taking: Reported on 04/21/2016 11/14/15 11/13/16  Gregor Hams, MD  HYDROcodone-acetaminophen (NORCO/VICODIN) 5-325 MG tablet Take 1 tablet by mouth every 4 (four) hours as needed for moderate pain. 04/27/17 04/27/18  Akhil Piscopo, Johnette Abraham B, FNP  lisinopril (PRINIVIL,ZESTRIL) 10 MG tablet Take 10 mg by mouth daily. 04/16/16 05/16/16  [provider]  predniSONE (DELTASONE) 10 MG tablet 50mg  by mouth for 4 days 04/21/16   Lisa Roca, MD    Allergies Lisinopril  No family history on file.  Social History Social History   Tobacco Use  . Smoking status: Current Every Day Smoker    Packs/day: 0.50    Types: Cigarettes    Last attempt to quit: 11/09/2015    Years since quitting: 1.4  . Smokeless tobacco: Never Used  Substance Use Topics  . Alcohol use: Yes    Comment: 1-2 beers twice a week  . Drug use: No    Review of Systems Constitutional: Negative for fever. ENT: Positive for dental pain. Musculoskeletal: Negative for myalgias Skin: Negative for erythema or edema.  Negative for rash, lesion, or wound. ____________________________________________   PHYSICAL EXAM:  VITAL SIGNS: ED Triage Vitals  Enc Vitals Group     BP 04/27/17 1959 Marland Kitchen)  165/87     Pulse Rate 04/27/17 1959 71     Resp 04/27/17 1959 20     Temp 04/27/17 1959 98.4 F (36.9 C)     Temp Source 04/27/17 1959 Oral     SpO2 04/27/17 1959 99 %     Weight 04/27/17 2000 255 lb (115.7 kg)     Height 04/27/17 2000 5\' 5"  (1.651 m)     Head Circumference --      Peak Flow --      Pain Score 04/27/17 1959 10     Pain Loc --      Pain Edu? --      Excl. in [place]? --     Constitutional: Alert and oriented. Well  appearing and in no acute distress. Eyes: Conjunctiva are clear without discharge or drainage. Mouth/Throat: See periodontal exam.  Uvula is midline.  No edema of the tongue.  Sublingual surface is soft. Periodontal Exam    Respiratory: Respirations even and unlabored. Musculoskeletal: Full, active range of motion of extremities observed. Neurologic: Awake, alert, oriented x4. Skin: Intact with no rash, lesion, or wound noted on exposed skin surface. Psychiatric: Affect and behavior are appropriate.  ____________________________________________   LABS (all labs ordered are listed, but only abnormal results are displayed)  Labs Reviewed - No data to display ____________________________________________   RADIOLOGY  Not indicated ____________________________________________   PROCEDURES  Procedure(s) performed: Dental block performed: Consent obtained from patient Right side inferior alveolar nerve block with 2% lidocaine with epi [place]marks identified. Patient tolerated the procedure well with no immediate complications.   Critical Care performed: No ____________________________________________   INITIAL IMPRESSION / ASSESSMENT AND PLAN / ED COURSE  Michael Ross is a 46 y.o. male who presents to the emergency department for evaluation and treatment of dental pain.  Dental block was performed while in the emergency department with immediate improvement.  Patient was encouraged to continue the antibiotic as prescribed.  He will be given 2 days supply of Norco.  He is to schedule an appointment with oral surgeon as soon as possible.  He was encouraged to follow-up with the dentist or return to the emergency department for symptoms that change or worsen if he is unable to schedule an appointment with oral surgeon.  Pertinent labs & imaging results that were available during my care of the patient were reviewed by me and considered in my medical decision making (see chart for  details).  ____________________________________________   FINAL CLINICAL IMPRESSION(S) / ED DIAGNOSES  Final diagnoses:  Pain, dental    This SmartLink is deprecated. Use AVSMEDLIST instead to display the medication list for a patient.  If controlled substance prescribed during this visit, 12 month history viewed on the [facility] prior to issuing an initial prescription for Schedule II or III opiod.  Note:  This document was prepared using Dragon voice recognition software and may include unintentional dictation errors.    Victorino Dike, FNP 04/27/17 2036    Nance Pear, MD 04/27/17 [phone]

## 2017-04-27 NOTE — ED Triage Notes (Signed)
Pt comes into the ED via [facility] c/o right lower dental pain.  Patient was seen at dentist earlier today and prescribed antibiotics but no medicine to help with pain.  Patient states he has not filled the antibiotics yet because he has no insurance.  Patient in NAD at this time.

## 2017-06-23 ENCOUNTER — Other Ambulatory Visit: Payer: Self-pay

## 2017-06-23 ENCOUNTER — Emergency Department: Payer: Self-pay

## 2017-06-23 ENCOUNTER — Emergency Department
Admission: EM | Admit: 2017-06-23 | Discharge: 2017-06-23 | Disposition: A | Discharge status: Home / Self Care | Payer: Self-pay | Attending: Nurse Practitioner | Admitting: Nurse Practitioner

## 2017-06-23 ENCOUNTER — Encounter: Payer: Self-pay | Admitting: Emergency Medicine

## 2017-06-23 DIAGNOSIS — I110 Hypertensive heart disease with heart failure: Secondary | ICD-10-CM | POA: Insufficient documentation

## 2017-06-23 DIAGNOSIS — Z79899 Other long term (current) drug therapy: Secondary | ICD-10-CM | POA: Insufficient documentation

## 2017-06-23 DIAGNOSIS — J189 Pneumonia, unspecified organism: Secondary | ICD-10-CM | POA: Insufficient documentation

## 2017-06-23 DIAGNOSIS — F17210 Nicotine dependence, cigarettes, uncomplicated: Secondary | ICD-10-CM | POA: Insufficient documentation

## 2017-06-23 DIAGNOSIS — I509 Heart failure, unspecified: Secondary | ICD-10-CM | POA: Insufficient documentation

## 2017-06-23 MED ORDER — PREDNISONE 50 MG PO TABS: 50.0000 mg | ORAL_TABLET | Freq: Every day | ORAL | 0 refills | Status: DC

## 2017-06-23 MED ORDER — HYDROCOD POLST-CPM POLST ER 10-8 MG/5ML PO SUER: 5.0000 mL | Freq: Two times a day (BID) | ORAL | 0 refills | Status: DC | PRN

## 2017-06-23 MED ORDER — AZITHROMYCIN 250 MG PO TABS: ORAL_TABLET | ORAL | 0 refills | Status: DC

## 2017-06-23 NOTE — ED Provider Notes (Signed)
[facility] Emergency Department Provider Note  ____________________________________________  Time seen: Approximately 4:02 PM  I have reviewed the triage vital signs and the nursing notes.   HISTORY  Chief Complaint Cough and URI    HPI Michael Ross is a 47 y.o. male who presents the emergency department complaining of 3 weeks of nonproductive coughing.  Patient reports that he began symptoms with a "cold."  Patient reports that symptoms began with nasal congestion, sore throat, cough.  Other symptoms resolved the cough persisted.  Patient reports that it is nonproductive.  No fevers or chills.  Patient has a history of CHF but states that he weighs daily and that his weights have not changed.  Patient reports that the cough is not consistent with his previous exacerbations of CHF.  He denies any lower extremity edema.  He denies any chest pain or shortness of breath.  Patient is taking his daily medications for hypertension and CHF.  Due to his CHF, he has been informed that he cannot take most over-the-counter medications for cough and cold.  He has not been taking any medicines for the cough.  Past Medical History:  Diagnosis Date  . CHF (congestive heart failure) ([place])   . Hypertension     There are no active problems to display for this patient.   Past Surgical History:  Procedure Laterality Date  . CARDIAC CATHETERIZATION      Prior to Admission medications   Medication Sig Start Date End Date Taking? Authorizing Provider  albuterol (PROVENTIL HFA;VENTOLIN HFA) 108 (90 Base) MCG/ACT inhaler Inhale 2 puffs into the lungs every 4 (four) hours as needed for wheezing or shortness of breath. Patient not taking: Reported on 04/21/2016 11/25/15   Gregor Hams, MD  atorvastatin (LIPITOR) 10 MG tablet Take 10 mg by mouth daily. 04/15/16 05/15/16  [provider]  azithromycin (ZITHROMAX Z-PAK) 250 MG tablet Take 2 tablets (500 mg) on  Day 1,   followed by 1 tablet (250 mg) once daily on Days 2 through 5. 06/23/17   Cuthriell, Charline Bills, PA-C  chlorpheniramine-HYDROcodone (TUSSIONEX) 10-8 MG/5ML SUER Take 5 mLs by mouth every 12 (twelve) hours as needed for cough. 06/23/17   Cuthriell, Charline Bills, PA-C  famotidine (PEPCID) 20 MG tablet Take 1 tablet (20 mg total) by mouth 2 (two) times daily. Patient not taking: Reported on 04/21/2016 02/12/16 02/11/17  Johnn Hai, PA-C  furosemide (LASIX) 40 MG tablet Take 40 mg by mouth daily. 04/16/16 05/16/16  [provider]  hydrochlorothiazide (MICROZIDE) 12.5 MG capsule Take 1 capsule (12.5 mg total) by mouth daily. Patient not taking: Reported on 04/21/2016 11/14/15 11/13/16  Gregor Hams, MD  lisinopril (PRINIVIL,ZESTRIL) 10 MG tablet Take 10 mg by mouth daily. 04/16/16 05/16/16  [provider]  predniSONE (DELTASONE) 50 MG tablet Take 1 tablet (50 mg total) by mouth daily with breakfast. 06/23/17   Cuthriell, Charline Bills, PA-C    Allergies Lisinopril  No family history on file.  Social History Social History   Tobacco Use  . Smoking status: Current Every Day Smoker    Packs/day: 0.50    Types: Cigarettes    Last attempt to quit: 11/09/2015    Years since quitting: 1.6  . Smokeless tobacco: Never Used  Substance Use Topics  . Alcohol use: Yes    Comment: 1-2 beers twice a week  . Drug use: No     Review of Systems  Constitutional: No fever/chills Eyes: No visual changes.  ENT: No upper respiratory complaints. Cardiovascular: no chest pain.  No increased lower extremity edema. Respiratory: Positive for nonproductive cough. No SOB. Gastrointestinal: No abdominal pain.  No nausea, no vomiting.  No diarrhea.  No constipation. Genitourinary: Negative for dysuria. No hematuria Musculoskeletal: Negative for musculoskeletal pain. Skin: Negative for rash, abrasions, lacerations, ecchymosis. Neurological: Negative for headaches, focal weakness or numbness. 10-point ROS  otherwise negative.  ____________________________________________   PHYSICAL EXAM:  VITAL SIGNS: ED Triage Vitals  Enc Vitals Group     BP 06/23/17 1504 132/90     Pulse Rate 06/23/17 1504 87     Resp 06/23/17 1504 20     Temp 06/23/17 1504 98.4 F (36.9 C)     Temp Source 06/23/17 1504 Oral     SpO2 06/23/17 1504 97 %     Weight 06/23/17 1505 255 lb (115.7 kg)     Height 06/23/17 1505 5\' 5"  (1.651 m)     Head Circumference --      Peak Flow --      Pain Score --      Pain Loc --      Pain Edu? --      Excl. in [place]? --      Constitutional: Alert and oriented. Well appearing and in no acute distress. Eyes: Conjunctivae are normal. PERRL. EOMI. Head: Atraumatic. ENT:      Ears: EACs and TMs unremarkable bilaterally.      Nose: No congestion/rhinnorhea.      Mouth/Throat: Mucous membranes are moist.  Oropharynx is nonerythematous and nonedematous Neck: No stridor.  Neck is supple full range of motion. Hematological/Lymphatic/Immunilogical: No cervical lymphadenopathy. Cardiovascular: Normal rate, regular rhythm. Normal S1 and S2.  Good peripheral circulation.  No lower or upper extremity edema. Respiratory: Normal respiratory effort without tachypnea or retractions. Lungs with a few mild crackles bilateral lower lung fields.  1 or 2 mild expiratory wheezes appreciated.  Otherwise, no rales, rhonchi. Good air entry to the bases with no decreased or absent breath sounds. Musculoskeletal: Full range of motion to all extremities. No gross deformities appreciated. Neurologic:  Normal speech and language. No gross focal neurologic deficits are appreciated.  Skin:  Skin is warm, dry and intact. No rash noted. Psychiatric: Mood and affect are normal. Speech and behavior are normal. Patient exhibits appropriate insight and judgement.   ____________________________________________   LABS (all labs ordered are listed, but only abnormal results are displayed)  Labs Reviewed - No  data to display ____________________________________________  EKG   ____________________________________________  RADIOLOGY Diamantina Providence Cuthriell, personally viewed and evaluated these images (plain radiographs) as part of my medical decision making, as well as reviewing the written report by the radiologist.  No consolidation consistent with pneumonia.  No findings consistent with pulmonary edema  Dg Chest 2 View  Result Date: 06/23/2017 CLINICAL DATA:  Cough for 3 weeks.  Tobacco use. EXAM: CHEST  2 VIEW COMPARISON:  February 12, 2016. FINDINGS: There is no edema or consolidation. The heart size and pulmonary vascularity are normal. No adenopathy. No bone lesions. IMPRESSION: No edema or consolidation. Electronically Signed   By: Lowella Grip III M.D.   On: 06/23/2017 15:38    ____________________________________________    PROCEDURES  Procedure(s) performed:    Procedures    Medications - No data to display   ____________________________________________   INITIAL IMPRESSION / ASSESSMENT AND PLAN / ED COURSE  Pertinent labs & imaging results that were available during my care of the patient were  reviewed by me and considered in my medical decision making (see chart for details).  Review of the [place] CSRS was performed in accordance of the [place] prior to dispensing any controlled drugs.     Patient's diagnosis is consistent with community acquired pneumonia. Patient with 3 weeks history of coughing. Patient with history of CHF but denies any lower extremity edema, productive cough, complaints of CHF related issues.  X-ray is reassuring with no indication of acute consolidation or pulmonary edema.  At this time, patient symptoms are consistent with community-acquired/walking pneumonia.  No indication for CHF exacerbation at this time.  As such, patient will be discharged home with prescriptions for zithromax, prednisone, tussionex. Patient is to follow up with primary  care as needed or otherwise directed. Patient is given ED precautions to return to the ED for any worsening or new symptoms.     ____________________________________________  FINAL CLINICAL IMPRESSION(S) / ED DIAGNOSES  Final diagnoses:  Community acquired pneumonia, unspecified laterality  Chronic congestive heart failure, unspecified heart failure type ([place])      NEW MEDICATIONS STARTED DURING THIS VISIT:  ED Discharge Orders        Ordered    azithromycin (ZITHROMAX Z-PAK) 250 MG tablet     06/23/17 1604    predniSONE (DELTASONE) 50 MG tablet  Daily with breakfast     06/23/17 1604    chlorpheniramine-HYDROcodone (TUSSIONEX) 10-8 MG/5ML SUER  Every 12 hours PRN     06/23/17 1604          This chart was dictated using voice recognition software/Dragon. Despite best efforts to proofread, errors can occur which can change the meaning. Any change was purely unintentional.    Darletta Moll, PA-C 06/23/17 1646    Eula Listen, MD 06/23/17 [phone]

## 2017-06-23 NOTE — ED Triage Notes (Signed)
Presents with a 3 week hx of cough,congestion and sinus drainage  Unsure of fever

## 2017-07-07 ENCOUNTER — Emergency Department
Admission: EM | Admit: 2017-07-07 | Discharge: 2017-07-07 | Disposition: A | Discharge status: Home / Self Care | Payer: Self-pay | Attending: Emergency Medicine | Admitting: Emergency Medicine

## 2017-07-07 ENCOUNTER — Encounter: Payer: Self-pay | Admitting: Emergency Medicine

## 2017-07-07 DIAGNOSIS — R0981 Nasal congestion: Secondary | ICD-10-CM | POA: Insufficient documentation

## 2017-07-07 DIAGNOSIS — R61 Generalized hyperhidrosis: Secondary | ICD-10-CM | POA: Insufficient documentation

## 2017-07-07 DIAGNOSIS — R05 Cough: Secondary | ICD-10-CM | POA: Insufficient documentation

## 2017-07-07 DIAGNOSIS — I509 Heart failure, unspecified: Secondary | ICD-10-CM | POA: Insufficient documentation

## 2017-07-07 DIAGNOSIS — Z20828 Contact with and (suspected) exposure to other viral communicable diseases: Secondary | ICD-10-CM | POA: Insufficient documentation

## 2017-07-07 DIAGNOSIS — J09X9 Influenza due to identified novel influenza A virus with other manifestations: Secondary | ICD-10-CM | POA: Insufficient documentation

## 2017-07-07 DIAGNOSIS — J101 Influenza due to other identified influenza virus with other respiratory manifestations: Secondary | ICD-10-CM

## 2017-07-07 DIAGNOSIS — Z79899 Other long term (current) drug therapy: Secondary | ICD-10-CM | POA: Insufficient documentation

## 2017-07-07 DIAGNOSIS — J3489 Other specified disorders of nose and nasal sinuses: Secondary | ICD-10-CM | POA: Insufficient documentation

## 2017-07-07 DIAGNOSIS — I110 Hypertensive heart disease with heart failure: Secondary | ICD-10-CM | POA: Insufficient documentation

## 2017-07-07 DIAGNOSIS — F17210 Nicotine dependence, cigarettes, uncomplicated: Secondary | ICD-10-CM | POA: Insufficient documentation

## 2017-07-07 LAB — INFLUENZA PANEL BY PCR (TYPE A & B)
Influenza A By PCR: POSITIVE — AB
Influenza B By PCR: NEGATIVE

## 2017-07-07 MED ORDER — BENZONATATE 100 MG PO CAPS: 200.0000 mg | ORAL_CAPSULE | Freq: Three times a day (TID) | ORAL | 0 refills | Status: AC | PRN

## 2017-07-07 MED ORDER — OSELTAMIVIR PHOSPHATE 75 MG PO CAPS: 75.0000 mg | ORAL_CAPSULE | Freq: Two times a day (BID) | ORAL | 0 refills | Status: AC

## 2017-07-07 NOTE — ED Notes (Signed)
Pt reports having fever last night at work but did not check with thermometer. Patient took tylenol last night and felt better. Reports chills and sweats on and off since last night

## 2017-07-07 NOTE — ED Triage Notes (Signed)
Pt comes into the ED via POV c/o fever that started yesterday.  Last use of tylenol was 19:00 yesterday evening.  Patient ambulatory to triage and in NAD.  Patient has even and unlabored respirations at this time.  Patient states he has cough and finished antibiotics 2 weeks ago for a cough.  Patient states he got better until the fever happened last night.

## 2017-07-07 NOTE — Discharge Instructions (Signed)
Follow-up with your primary care provider if any continued problems.  Begin taking Tamiflu 7 5 mg 1 twice a day for the next 5 days.  Tessalon tablets 2 tablets 3 times a day if needed for cough.  Tylenol as needed for fever.  Increase fluids to stay hydrated.

## 2017-07-07 NOTE — ED Notes (Signed)
Flu swab obtained and sent to lab

## 2017-07-07 NOTE — ED Provider Notes (Signed)
[facility] Emergency Department Provider Note  ____________________________________________   First MD Initiated Contact with Patient 07/07/17 [phone]     (approximate)  I have reviewed the triage vital signs and the nursing notes.   HISTORY  Chief Complaint Fever  HPI Michael Ross is a 47 y.o. male patient is here with complaint of sudden onset of fever last evening at work.  He states that he began coughing but was seen earlier in the month for cough and diagnosed with pneumonia.  He states the cough was getting better until last evening.  He took Tylenol last evening and then had some sweating.  He denies any vomiting or diarrhea.  He states he has been around his mother who has been diagnosed with flu.  Past Medical History:  Diagnosis Date  . CHF (congestive heart failure) ([place])   . Hypertension     There are no active problems to display for this patient.   Past Surgical History:  Procedure Laterality Date  . CARDIAC CATHETERIZATION      Prior to Admission medications   Medication Sig Start Date End Date Taking? Authorizing Provider  atorvastatin (LIPITOR) 10 MG tablet Take 10 mg by mouth daily. 04/15/16 05/15/16  [provider]  benzonatate (TESSALON PERLES) 100 MG capsule Take 2 capsules (200 mg total) by mouth 3 (three) times daily as needed. 07/07/17 07/07/18  Johnn Hai, PA-C  furosemide (LASIX) 40 MG tablet Take 40 mg by mouth daily. 04/16/16 05/16/16  [provider]  lisinopril (PRINIVIL,ZESTRIL) 10 MG tablet Take 10 mg by mouth daily. 04/16/16 05/16/16  [provider]  oseltamivir (TAMIFLU) 75 MG capsule Take 1 capsule (75 mg total) by mouth 2 (two) times daily for 5 days. 07/07/17 07/12/17  Johnn Hai, PA-C    Allergies Lisinopril  No family history on file.  Social History Social History   Tobacco Use  . Smoking status: Current Every Day Smoker    Packs/day: 0.50    Types: Cigarettes   Last attempt to quit: 11/09/2015    Years since quitting: 1.6  . Smokeless tobacco: Never Used  Substance Use Topics  . Alcohol use: Yes    Comment: 1-2 beers twice a week  . Drug use: No    Review of Systems Constitutional: Positive fever/chills Eyes: No visual changes. ENT: No sore throat. Cardiovascular: Denies chest pain. Respiratory: Denies shortness of breath.  Positive nonproductive cough. Gastrointestinal: No abdominal pain.  No nausea, no vomiting.  Musculoskeletal: Positive for muscle aches. Skin: Negative for rash. Neurological: Negative for headaches, focal weakness or numbness. ___________________________________________   PHYSICAL EXAM:  VITAL SIGNS: ED Triage Vitals  Enc Vitals Group     BP 07/07/17 0830 116/63     Pulse Rate 07/07/17 0830 84     Resp 07/07/17 0830 18     Temp 07/07/17 0830 99.6 F (37.6 C)     Temp Source 07/07/17 0830 Oral     SpO2 07/07/17 0830 96 %     Weight 07/07/17 0829 255 lb (115.7 kg)     Height 07/07/17 0829 5\' 5"  (1.651 m)     Head Circumference --      Peak Flow --      Pain Score 07/07/17 0829 8     Pain Loc --      Pain Edu? --      Excl. in [place]? --    Constitutional: Alert and oriented. Well appearing and in no acute distress. Eyes:  Conjunctivae are normal.  Head: Atraumatic. Nose: Mild congestion/rhinnorhea.  TMs are dull bilaterally. Mouth/Throat: Mucous membranes are moist.  Oropharynx non-erythematous.  Positive posterior drainage. Neck: No stridor.   Hematological/Lymphatic/Immunilogical: No cervical lymphadenopathy. Cardiovascular: Normal rate, regular rhythm. Grossly normal heart sounds.  Good peripheral circulation. Respiratory: Normal respiratory effort.  No retractions. Lungs CTAB. Gastrointestinal: Soft and nontender. No distention. No abdominal bruits. No CVA tenderness. Musculoskeletal: Moves upper and lower extremities without any difficulty. Neurologic:  Normal speech and language. No gross focal  neurologic deficits are appreciated. No gait instability. Skin:  Skin is warm, dry and intact. No rash noted. Psychiatric: Mood and affect are normal. Speech and behavior are normal.  ____________________________________________   LABS (all labs ordered are listed, but only abnormal results are displayed)  Labs Reviewed  INFLUENZA PANEL BY PCR (TYPE A & B) - Abnormal; Notable for the following components:      Result Value   Influenza A By PCR POSITIVE (*)    All other components within normal limits    ____________________________________________   PROCEDURES  Procedure(s) performed: None  Procedures  Critical Care performed: No  ____________________________________________   INITIAL IMPRESSION / ASSESSMENT AND PLAN / ED COURSE Patient was made aware that he is positive for influenza A.  He will continue with his regular medication also take Tessalon Perles as needed for cough.  Patient will take Tylenol as needed for fever.  Patient was given a note to remain out of work at this time.  He is to follow-up with his PCP if any continued problems or return to the ED if any severe worsening over the weekend. ____________________________________________   FINAL CLINICAL IMPRESSION(S) / ED DIAGNOSES  Final diagnoses:  Influenza A     ED Discharge Orders        Ordered    oseltamivir (TAMIFLU) 75 MG capsule  2 times daily     07/07/17 1018    benzonatate (TESSALON PERLES) 100 MG capsule  3 times daily PRN     07/07/17 1018       Note:  This document was prepared using Dragon voice recognition software and may include unintentional dictation errors.    Johnn Hai, PA-C 07/07/17 1024    Delman Kitten, MD 07/07/17 1626

## 2017-09-28 IMAGING — DX DG CHEST 1V PORT
1 series · 1 of 1 positions shown · non-contrast
Comparison: 11/14/2015

CLINICAL DATA: SOB and cough. Just finished breathing treatment
time. Pt stated no prev surg or any conditions. Shielded.

EXAM:
PORTABLE CHEST 1 VIEW

[chest ap]
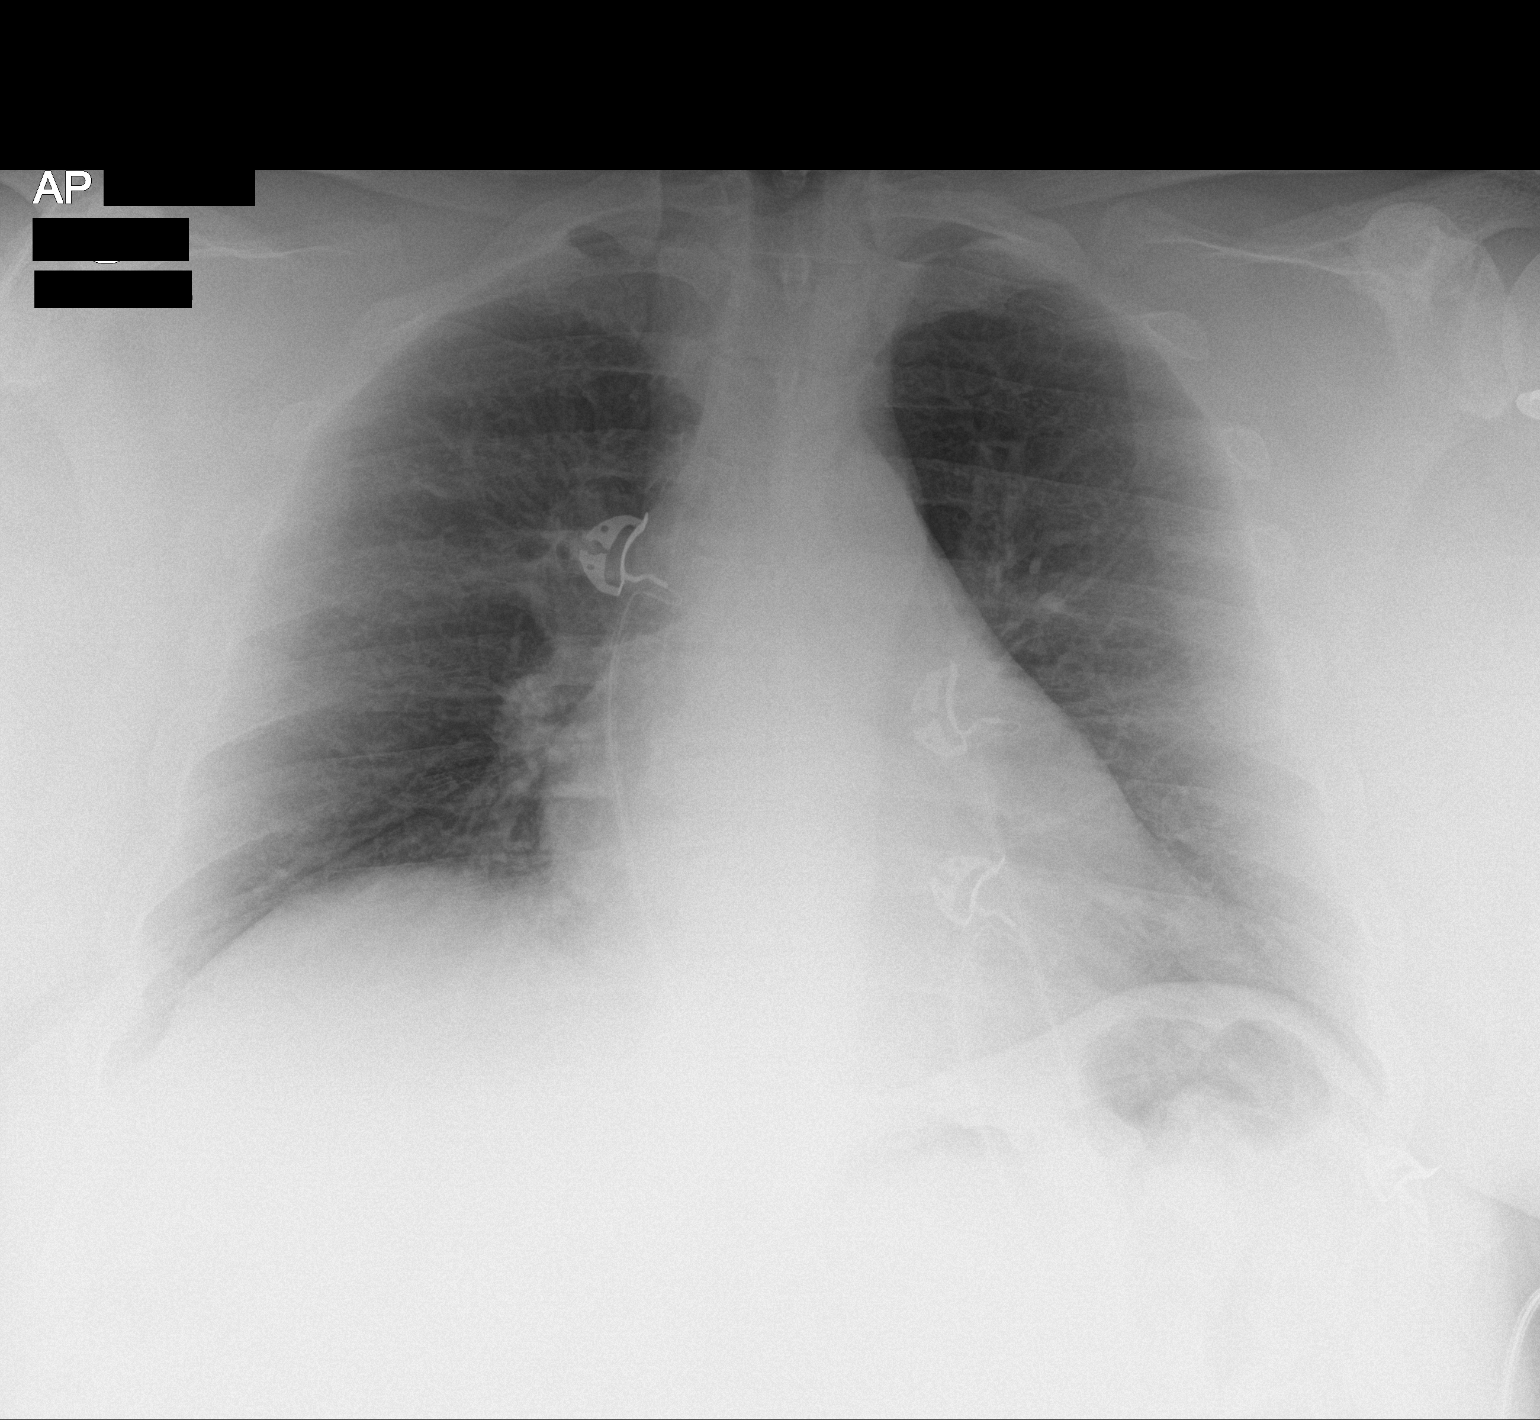

[1 of 1 positions shown; findings below may reference images not displayed]

FINDINGS: Borderline heart size without vascular congestion. No edema or
consolidation. No blunting of costophrenic angles. No pneumothorax.
Mediastinal contours appear intact.
IMPRESSION: No active disease.

## 2018-03-26 ENCOUNTER — Emergency Department: Payer: Self-pay

## 2018-03-26 ENCOUNTER — Other Ambulatory Visit: Payer: Self-pay

## 2018-03-26 ENCOUNTER — Emergency Department
Admission: EM | Admit: 2018-03-26 | Discharge: 2018-03-26 | Disposition: A | Discharge status: Home / Self Care | Payer: Self-pay | Attending: Emergency Medicine | Admitting: Emergency Medicine

## 2018-03-26 DIAGNOSIS — Z79899 Other long term (current) drug therapy: Secondary | ICD-10-CM | POA: Insufficient documentation

## 2018-03-26 DIAGNOSIS — F17210 Nicotine dependence, cigarettes, uncomplicated: Secondary | ICD-10-CM | POA: Insufficient documentation

## 2018-03-26 DIAGNOSIS — R079 Chest pain, unspecified: Secondary | ICD-10-CM | POA: Insufficient documentation

## 2018-03-26 DIAGNOSIS — I10 Essential (primary) hypertension: Secondary | ICD-10-CM | POA: Insufficient documentation

## 2018-03-26 LAB — BASIC METABOLIC PANEL
Anion gap: 11 (ref 5–15)
BUN: 18 mg/dL (ref 6–20)
CALCIUM: 8.7 mg/dL — AB (ref 8.9–10.3)
CO2: 28 mmol/L (ref 22–32)
CREATININE: 1.31 mg/dL — AB (ref 0.61–1.24)
Chloride: 96 mmol/L — ABNORMAL LOW (ref 98–111)
Glucose, Bld: 118 mg/dL — ABNORMAL HIGH (ref 70–99)
Potassium: 4 mmol/L (ref 3.5–5.1)
SODIUM: 135 mmol/L (ref 135–145)

## 2018-03-26 LAB — CBC
HCT: 45.2 % (ref 39.0–52.0)
Hemoglobin: 14.2 g/dL (ref 13.0–17.0)
MCH: 29 pg (ref 26.0–34.0)
MCHC: 31.4 g/dL (ref 30.0–36.0)
MCV: 92.4 fL (ref 80.0–100.0)
NRBC: 0 % (ref 0.0–0.2)
PLATELETS: 371 10*3/uL (ref 150–400)
RBC: 4.89 MIL/uL (ref 4.22–5.81)
RDW: 13 % (ref 11.5–15.5)
WBC: 14.2 10*3/uL — AB (ref 4.0–10.5)

## 2018-03-26 LAB — TROPONIN I: Troponin I: <0.03 ng/mL (ref <0.03)

## 2018-03-26 NOTE — ED Triage Notes (Signed)
Pt c/o achy chest pain since waking this morning with nausea and diaphoresis. Skin in warm and dry on arrival. Respirations WNL . VSS

## 2018-03-26 NOTE — ED Provider Notes (Signed)
[facility] Emergency Department Provider Note  Time seen: 8:00 PM  I have reviewed the triage vital signs and the nursing notes.   HISTORY  Chief Complaint Chest Pain    HPI Michael Ross is a 47 y.o. male with a past medical history of hypertension, CHF, presents to the emergency department for chest discomfort.  According to the patient since this morning he has been experiencing intermittent chest pain.  Describes it as a very sharp pain that only occurs if he turns his head certain directions or moves in certain ways.  States the pain lasts approximately 1 to 2 seconds and then goes away.  He was concerned so he came to the emergency department for evaluation.  Patient denies any trouble breathing nausea or diaphoresis, contrary to triage note.  Denies any leg pain or swelling.  No abdominal pain, vomiting diarrhea fever cough or congestion.  Largely negative review of systems.  Denies any discomfort currently.   Past Medical History:  Diagnosis Date  . CHF (congestive heart failure) ([place])   . Hypertension     There are no active problems to display for this patient.   Past Surgical History:  Procedure Laterality Date  . CARDIAC CATHETERIZATION      Prior to Admission medications   Medication Sig Start Date End Date Taking? Authorizing Provider  atorvastatin (LIPITOR) 10 MG tablet Take 10 mg by mouth daily. 04/15/16 05/15/16  [provider]  benzonatate (TESSALON PERLES) 100 MG capsule Take 2 capsules (200 mg total) by mouth 3 (three) times daily as needed. 07/07/17 07/07/18  Johnn Hai, PA-C  furosemide (LASIX) 40 MG tablet Take 40 mg by mouth daily. 04/16/16 05/16/16  [provider]  lisinopril (PRINIVIL,ZESTRIL) 10 MG tablet Take 10 mg by mouth daily. 04/16/16 05/16/16  [provider]    Allergies  Allergen Reactions  . Lisinopril Swelling    angioedema    No family history on file.  Social History Social  History   Tobacco Use  . Smoking status: Current Every Day Smoker    Packs/day: 0.50    Types: Cigarettes    Last attempt to quit: 11/09/2015    Years since quitting: 2.3  . Smokeless tobacco: Never Used  Substance Use Topics  . Alcohol use: Yes    Comment: 1-2 beers twice a week  . Drug use: No    Review of Systems Constitutional: Negative for fever. Cardiovascular: Intermittent sharp chest pain Respiratory: Negative for shortness of breath. Gastrointestinal: Negative for abdominal pain, vomiting Musculoskeletal: Negative for musculoskeletal complaints Skin: Negative for skin complaints  Neurological: Negative for headache All other ROS negative  ____________________________________________   PHYSICAL EXAM:  VITAL SIGNS: ED Triage Vitals  Enc Vitals Group     BP 03/26/18 1629 125/60     Pulse Rate 03/26/18 1629 61     Resp 03/26/18 1629 18     Temp 03/26/18 1629 98.6 F (37 C)     Temp Source 03/26/18 1629 Oral     SpO2 03/26/18 1629 95 %     Weight 03/26/18 1627 250 lb (113.4 kg)     Height 03/26/18 1627 5\' 5"  (1.651 m)     Head Circumference --      Peak Flow --      Pain Score 03/26/18 1627 8     Pain Loc --      Pain Edu? --      Excl. in [place]? --    Constitutional:  Alert and oriented. Well appearing and in no distress. Eyes: Normal exam ENT   Head: Normocephalic and atraumatic.   Mouth/Throat: Mucous membranes are moist. Cardiovascular: Normal rate, regular rhythm. No murmur Respiratory: Normal respiratory effort without tachypnea nor retractions. Breath sounds are clear.  Chest wall is nontender to palpation. Gastrointestinal: Soft and nontender. No distention.   Musculoskeletal: Nontender with normal range of motion in all extremities. No lower extremity tenderness or edema. Neurologic:  Normal speech and language. No gross focal neurologic deficits Skin:  Skin is warm, dry and intact.  Psychiatric: Mood and affect are normal.    ____________________________________________    EKG  EKG reviewed and interpreted by myself shows a sinus rhythm at 68 bpm with a narrow QRS, normal axis, normal intervals, no concerning ST changes.  ____________________________________________    RADIOLOGY  Chest x-ray is negative  ____________________________________________   INITIAL IMPRESSION / ASSESSMENT AND PLAN / ED COURSE  Pertinent labs & imaging results that were available during my care of the patient were reviewed by me and considered in my medical decision making (see chart for details).  Patient presents to the emergency department for chest pain intermittent since earlier this morning.  States the pain lasts only 1 or 2 seconds and then goes away only occurs with certain movements of his head or chest.  Denies any discomfort currently.  Patient's work-up is largely reassuring including a negative troponin.  Does have a mild leukocytosis of 14,000.  Chest x-ray is negative, EKG is reassuring as well.  We will repeat a troponin.  If the patient's repeat troponin is negative anticipate likely discharge home with cardiology follow-up.  Follows up at [facility].  Patient agreeable to plan of care.  Repeat troponin remains negative.  Patient feels well.  Continues to have no chest pain at this time.  Reassuring vitals.  Patient will be discharged with cardiology follow-up with his cardiologist.  Patient agreeable to plan of care.  Provided my normal chest pain return precautions.  ____________________________________________   FINAL CLINICAL IMPRESSION(S) / ED DIAGNOSES  Chest pain    Harvest Dark, MD 03/26/18 2104

## 2018-12-03 ENCOUNTER — Other Ambulatory Visit: Payer: Self-pay

## 2018-12-03 ENCOUNTER — Encounter: Payer: Self-pay | Admitting: Emergency Medicine

## 2018-12-03 ENCOUNTER — Emergency Department
Admission: EM | Admit: 2018-12-03 | Discharge: 2018-12-03 | Disposition: A | Discharge status: Home / Self Care | Payer: Self-pay | Attending: Emergency Medicine | Admitting: Emergency Medicine

## 2018-12-03 DIAGNOSIS — F17210 Nicotine dependence, cigarettes, uncomplicated: Secondary | ICD-10-CM | POA: Insufficient documentation

## 2018-12-03 DIAGNOSIS — I509 Heart failure, unspecified: Secondary | ICD-10-CM | POA: Insufficient documentation

## 2018-12-03 DIAGNOSIS — H1031 Unspecified acute conjunctivitis, right eye: Secondary | ICD-10-CM

## 2018-12-03 DIAGNOSIS — H10021 Other mucopurulent conjunctivitis, right eye: Secondary | ICD-10-CM | POA: Insufficient documentation

## 2018-12-03 DIAGNOSIS — I110 Hypertensive heart disease with heart failure: Secondary | ICD-10-CM | POA: Insufficient documentation

## 2018-12-03 MED ORDER — GENTAMICIN FORTIFIED OPHTHALMIC SOLUTION: 2.0000 [drp] | OPHTHALMIC | 0 refills | Status: DC

## 2018-12-03 MED ORDER — FLUORESCEIN SODIUM 1 MG OP STRP
1.0000 | ORAL_STRIP | Freq: Once | OPHTHALMIC | Status: DC
  Filled 2018-12-03: qty 1

## 2018-12-03 MED ORDER — TETRACAINE HCL 0.5 % OP SOLN
2.0000 [drp] | Freq: Once | OPHTHALMIC | Status: DC
  Filled 2018-12-03: qty 4

## 2018-12-03 NOTE — Discharge Instructions (Signed)
Call make an appointment with Dr. George Ina if any continued problems or not improving.  Begin using gentamicin ophthalmic solution 2 drops right eye every 4 hours while you are awake.  Do not rub your right eye and then touch your left eye as you can infect the other eye.  Also avoid contact with other people as you can transmit this.

## 2018-12-03 NOTE — ED Notes (Signed)
See triage note  Presents with swelling and redness to right eye yesterday  Also states the eye was draining this am

## 2018-12-03 NOTE — ED Notes (Signed)
VISUAL ACUITY:  20/25 left 20/20 right

## 2018-12-03 NOTE — ED Triage Notes (Signed)
Right ieye itching, exudate  And red since yesterday.

## 2018-12-03 NOTE — ED Provider Notes (Signed)
[facility] Emergency Department Provider Note   ____________________________________________   First MD Initiated Contact with Patient 12/03/18 [phone]     (approximate)  I have reviewed the triage vital signs and the nursing notes.   HISTORY  Chief Complaint Eye Problem   HPI Michael Ross is a 48 y.o. male presents to the ED with complaint of eye drainage and redness since yesterday.  He states that the drainage got worse during the night.  He denies any significant change in his vision.  He states his eye began itching initially and has developed into drainage and redness.  He is unaware of anyone at work with [place].  He rates his discomfort as 4 out of 10.      Past Medical History:  Diagnosis Date  . CHF (congestive heart failure) ([place])   . Hypertension     There are no active problems to display for this patient.   Past Surgical History:  Procedure Laterality Date  . CARDIAC CATHETERIZATION      Prior to Admission medications   Medication Sig Start Date End Date Taking? Authorizing Provider  atorvastatin (LIPITOR) 10 MG tablet Take 10 mg by mouth daily. 04/15/16 05/15/16  [provider]  furosemide (LASIX) 40 MG tablet Take 40 mg by mouth daily. 04/16/16 05/16/16  [provider]  Gentamicin Sulfate (GENTAMICIN FORTIFIED) 7 ml SOLN Place 0.1 mLs (2 drops total) into the right eye every 4 (four) hours. While awake 12/03/18   Johnn Hai, PA-C    Allergies Lisinopril  No family history on file.  Social History Social History   Tobacco Use  . Smoking status: Current Every Day Smoker    Packs/day: 0.50    Types: Cigarettes    Last attempt to quit: 11/09/2015    Years since quitting: 3.0  . Smokeless tobacco: Never Used  Substance Use Topics  . Alcohol use: Yes    Comment: 1-2 beers twice a week  . Drug use: No    Review of Systems Constitutional: No fever/chills Eyes: Right eye erythematous and draining  ENT: No sore throat. Cardiovascular: Denies chest pain. Respiratory: Denies shortness of breath. Neurological: Negative for headaches ____________________________________________   PHYSICAL EXAM:  VITAL SIGNS: ED Triage Vitals  Enc Vitals Group     BP 12/03/18 0839 139/62     Pulse Rate 12/03/18 0839 77     Resp 12/03/18 0839 16     Temp 12/03/18 0839 99.4 F (37.4 C)     Temp Source 12/03/18 0839 Oral     SpO2 12/03/18 0839 95 %     Weight --      Height --      Head Circumference --      Peak Flow --      Pain Score 12/03/18 0840 4     Pain Loc --      Pain Edu? --      Excl. in [place]? --     Constitutional: Alert and oriented. Well appearing and in no acute distress. Eyes: Conjunctivae on the right is injected.  There is evidence of yellow exudate in the eyelashes and in the corner.  No foreign body was noted.  PERRL. EOMI. visual acuity was noted. Head: Atraumatic. Nose: No congestion/rhinnorhea. Neck: No stridor.   Cardiovascular: Normal rate, regular rhythm. Grossly normal heart sounds.  Good peripheral circulation. Respiratory: Normal respiratory effort.  No retractions. Lungs CTAB. Musculoskeletal: Moves upper and lower extremities without difficulty.  Normal gait  was noted. Neurologic:  Normal speech and language. No gross focal neurologic deficits are appreciated. No gait instability. Skin:  Skin is warm, dry and intact. No rash noted. Psychiatric: Mood and affect are normal. Speech and behavior are normal.  ____________________________________________   LABS (all labs ordered are listed, but only abnormal results are displayed)  Labs Reviewed - No data to display   PROCEDURES  Procedure(s) performed (including Critical Care):  Procedures   ____________________________________________   INITIAL IMPRESSION / ASSESSMENT AND PLAN / ED COURSE  As part of my medical decision making, I reviewed the following data within the electronic MEDICAL RECORD NUMBER  Notes from prior ED visits and [place] Controlled Substance Database  48 year old male presents to the ED with complaint of redness and yellow discharge from his right eye that began yesterday.  Patient states that he has had a lot of itching.  He denies any visual changes.  Patient was made aware that this is contagious and that he cannot go to work until he has improved and no drainage from his eye.  Patient was given a prescription for gentamicin ophthalmic solution to be used every 4 hours while awake.  He is to follow-up with Dr. George Ina if no improvement.  ____________________________________________   FINAL CLINICAL IMPRESSION(S) / ED DIAGNOSES  Final diagnoses:  Acute bacterial conjunctivitis of right eye     ED Discharge Orders         Ordered    Gentamicin Sulfate (GENTAMICIN FORTIFIED) 7 ml SOLN  Every 4 hours,   Status:  Discontinued     12/03/18 0946    Gentamicin Sulfate (GENTAMICIN FORTIFIED) 7 ml SOLN  Every 4 hours     12/03/18 0946           Note:  This document was prepared using Dragon voice recognition software and may include unintentional dictation errors.    Johnn Hai, PA-C 12/03/18 1004    Harvest Dark, MD 12/03/18 1435

## 2018-12-05 ENCOUNTER — Emergency Department
Admission: EM | Admit: 2018-12-05 | Discharge: 2018-12-05 | Disposition: A | Discharge status: Home / Self Care | Payer: Self-pay | Attending: Emergency Medicine | Admitting: Emergency Medicine

## 2018-12-05 ENCOUNTER — Encounter: Payer: Self-pay | Admitting: Emergency Medicine

## 2018-12-05 ENCOUNTER — Other Ambulatory Visit: Payer: Self-pay

## 2018-12-05 DIAGNOSIS — B309 Viral conjunctivitis, unspecified: Secondary | ICD-10-CM | POA: Insufficient documentation

## 2018-12-05 DIAGNOSIS — I509 Heart failure, unspecified: Secondary | ICD-10-CM | POA: Insufficient documentation

## 2018-12-05 DIAGNOSIS — I110 Hypertensive heart disease with heart failure: Secondary | ICD-10-CM | POA: Insufficient documentation

## 2018-12-05 DIAGNOSIS — H53149 Visual discomfort, unspecified: Secondary | ICD-10-CM | POA: Insufficient documentation

## 2018-12-05 DIAGNOSIS — F17210 Nicotine dependence, cigarettes, uncomplicated: Secondary | ICD-10-CM | POA: Insufficient documentation

## 2018-12-05 MED ORDER — NAPHAZOLINE-PHENIRAMINE 0.025-0.3 % OP SOLN: 1.0000 [drp] | Freq: Four times a day (QID) | OPHTHALMIC | 0 refills | Status: DC | PRN

## 2018-12-05 NOTE — ED Provider Notes (Signed)
[facility] Emergency Department Provider Note   ____________________________________________   First MD Initiated Contact with Patient 12/05/18 1037     (approximate)  I have reviewed the triage vital signs and the nursing notes.   HISTORY  Chief Complaint Conjunctivitis    HPI Michael Ross is a 48 y.o. male patient presents with continued tearing and redness of the eye status post diagnosis of conjunctivitis 2 days ago.  Patient is using antibiotic eyedrops with no noticeable improvement.  Patient denies vision disturbance.  Patient stated mild photophobia.  Patient state attempted to work today but was sent home by employer.  It was noticed patient blood pressure is elevated but has not taken his medication today.         Past Medical History:  Diagnosis Date  . CHF (congestive heart failure) ([place])   . Hypertension     There are no active problems to display for this patient.   Past Surgical History:  Procedure Laterality Date  . CARDIAC CATHETERIZATION      Prior to Admission medications   Medication Sig Start Date End Date Taking? Authorizing Provider  atorvastatin (LIPITOR) 10 MG tablet Take 10 mg by mouth daily. 04/15/16 05/15/16  [provider]  furosemide (LASIX) 40 MG tablet Take 40 mg by mouth daily. 04/16/16 05/16/16  [provider]  Gentamicin Sulfate (GENTAMICIN FORTIFIED) 7 ml SOLN Place 0.1 mLs (2 drops total) into the right eye every 4 (four) hours. While awake 12/03/18   Johnn Hai, PA-C  naphazoline-pheniramine (NAPHCON-A) 0.025-0.3 % ophthalmic solution Place 1 drop into the right eye 4 (four) times daily as needed for eye irritation. 12/05/18   Sable Feil, PA-C    Allergies Lisinopril  No family history on file.  Social History Social History   Tobacco Use  . Smoking status: Current Every Day Smoker    Packs/day: 0.50    Types: Cigarettes    Last attempt to quit: 11/09/2015    Years  since quitting: 3.0  . Smokeless tobacco: Never Used  Substance Use Topics  . Alcohol use: Yes    Comment: 1-2 beers twice a week  . Drug use: No    Review of Systems Constitutional: No fever/chills Eyes: No visual changes.  Redness and tearing right eye. ENT: No sore throat. Cardiovascular: Denies chest pain. Respiratory: Denies shortness of breath. Gastrointestinal: No abdominal pain.  No nausea, no vomiting.  No diarrhea.  No constipation. Genitourinary: Negative for dysuria. Musculoskeletal: Negative for back pain. Skin: Negative for rash. Neurological: Negative for headaches, focal weakness or numbness. Allergic/Immunilogical: Lisinopril. ____________________________________________   PHYSICAL EXAM:  VITAL SIGNS: ED Triage Vitals [12/05/18 1032]  Enc Vitals Group     BP (!) 186/92     Pulse Rate 79     Resp 17     Temp 98.1 F (36.7 C)     Temp Source Oral     SpO2 97 %     Weight 260 lb (117.9 kg)     Height 5\' 5"  (1.651 m)     Head Circumference      Peak Flow      Pain Score 5     Pain Loc      Pain Edu?      Excl. in [place]?     Constitutional: Alert and oriented. Well appearing and in no acute distress. Eyes: Right conjunctiva is mildly injected.   PERRL. EOMI. clear drainage. Nose: No congestion/rhinnorhea. Cardiovascular: Normal rate, regular  rhythm. Grossly normal heart sounds.  Good peripheral circulation.  Elevated blood pressure. Respiratory: Normal respiratory effort.  No retractions. Lungs CTAB. Neurologic:  Normal speech and language. No gross focal neurologic deficits are appreciated. No gait instability. Skin:  Skin is warm, dry and intact. No rash noted. Psychiatric: Mood and affect are normal. Speech and behavior are normal.  ____________________________________________   LABS (all labs ordered are listed, but only abnormal results are displayed)  Labs Reviewed - No data to display ____________________________________________  EKG    ____________________________________________  RADIOLOGY  ED MD interpretation:    Official radiology report(s): No results found.  ____________________________________________   PROCEDURES  Procedure(s) performed (including Critical Care):  Procedures   ____________________________________________   INITIAL IMPRESSION / ASSESSMENT AND PLAN / ED COURSE  As part of my medical decision making, I reviewed the following data within the [place] was evaluated in Emergency Department on 12/05/2018 for the symptoms described in the history of present illness. He was evaluated in the context of the global COVID-19 pandemic, which necessitated consideration that the patient might be at risk for infection with the SARS-CoV-2 virus that causes COVID-19. Institutional protocols and algorithms that pertain to the evaluation of patients at risk for COVID-19 are in a state of rapid change based on information released by regulatory bodies including the CDC and federal and state organizations. These policies and algorithms were followed during the patient's care in the ED.   Patient presents with redness and clear drainage from right eye consistent with viral versus allergic conjunctivitis.  Patient given discharge care instruction and prescription for Naphcon eyedrops.  Patient given a work note advised to follow ophthalmology if no improvement in 1 week.      ____________________________________________   FINAL CLINICAL IMPRESSION(S) / ED DIAGNOSES  Final diagnoses:  Acute viral conjunctivitis of right eye     ED Discharge Orders         Ordered    naphazoline-pheniramine (NAPHCON-A) 0.025-0.3 % ophthalmic solution  4 times daily PRN     12/05/18 1044           Note:  This document was prepared using Dragon voice recognition software and may include unintentional dictation errors.    Sable Feil, PA-C 12/05/18 1049     Carrie Mew, MD 12/05/18 [phone]

## 2018-12-05 NOTE — ED Triage Notes (Signed)
Pt's right eye red and irritated. Pt states the symptoms started Monday. Pt states he is back today due to no relief from prescribed medication and states "I need a note so I can go back to work."

## 2018-12-05 NOTE — ED Notes (Signed)
See triage note.  Right eye remains red and draining  States he has been using the eye medication from Monday

## 2018-12-10 ENCOUNTER — Other Ambulatory Visit: Payer: Self-pay

## 2018-12-10 ENCOUNTER — Emergency Department: Payer: Self-pay

## 2018-12-10 ENCOUNTER — Encounter: Payer: Self-pay | Admitting: Emergency Medicine

## 2018-12-10 ENCOUNTER — Emergency Department
Admission: EM | Admit: 2018-12-10 | Discharge: 2018-12-10 | Disposition: A | Discharge status: Home / Self Care | Payer: Self-pay | Attending: Emergency Medicine | Admitting: Emergency Medicine

## 2018-12-10 DIAGNOSIS — Z79899 Other long term (current) drug therapy: Secondary | ICD-10-CM | POA: Insufficient documentation

## 2018-12-10 DIAGNOSIS — I509 Heart failure, unspecified: Secondary | ICD-10-CM | POA: Insufficient documentation

## 2018-12-10 DIAGNOSIS — F17210 Nicotine dependence, cigarettes, uncomplicated: Secondary | ICD-10-CM | POA: Insufficient documentation

## 2018-12-10 DIAGNOSIS — H1033 Unspecified acute conjunctivitis, bilateral: Secondary | ICD-10-CM | POA: Insufficient documentation

## 2018-12-10 DIAGNOSIS — Z20828 Contact with and (suspected) exposure to other viral communicable diseases: Secondary | ICD-10-CM | POA: Insufficient documentation

## 2018-12-10 DIAGNOSIS — I110 Hypertensive heart disease with heart failure: Secondary | ICD-10-CM | POA: Insufficient documentation

## 2018-12-10 LAB — SARS CORONAVIRUS 2 BY RT PCR (HOSPITAL ORDER, PERFORMED IN ~~LOC~~ HOSPITAL LAB): SARS Coronavirus 2: NEGATIVE

## 2018-12-10 MED ORDER — FLUORESCEIN SODIUM 1 MG OP STRP
1.0000 | ORAL_STRIP | Freq: Once | OPHTHALMIC | Status: AC
  Administered 2018-12-10: 1 via OPHTHALMIC
  Filled 2018-12-10: qty 1

## 2018-12-10 MED ORDER — TETRACAINE HCL 0.5 % OP SOLN
1.0000 [drp] | Freq: Once | OPHTHALMIC | Status: AC
  Administered 2018-12-10: 15:00:00 1 [drp] via OPHTHALMIC
  Filled 2018-12-10: qty 4

## 2018-12-10 MED ORDER — MOXIFLOXACIN HCL 0.5 % OP SOLN
1.0000 [drp] | Freq: Three times a day (TID) | OPHTHALMIC | Status: DC
  Administered 2018-12-10: 15:00:00 1 [drp] via OPHTHALMIC
  Filled 2018-12-10: qty 3

## 2018-12-10 NOTE — ED Triage Notes (Signed)
Pt here with c/o right and left eye redness, swelling and pain, was treated here for the same last week. States new cough as well. NAD.

## 2018-12-10 NOTE — ED Notes (Signed)
See triage note  Presents with pain and redness to both eyes  States he has been seen twice for same   But states he is also having some sinus pressure

## 2018-12-10 NOTE — Discharge Instructions (Signed)
Call [facility] Wednesday morning as the office has instructed for your follow-up.  Discontinue using the eyedrops that you are currently using.  Begin using the Vigamox eyedrops that you were given in the ED 1 drop each eye 3 times daily.  Also wash your hands each time you touch her eyes and do not touch the dropper with your hands after using it in your eyes.
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
## 2019-02-26 ENCOUNTER — Other Ambulatory Visit: Payer: Self-pay

## 2019-02-26 DIAGNOSIS — Z20822 Contact with and (suspected) exposure to covid-19: Secondary | ICD-10-CM

## 2019-02-28 ENCOUNTER — Telehealth: Payer: Self-pay | Admitting: General Practice

## 2019-02-28 LAB — NOVEL CORONAVIRUS, NAA: SARS-CoV-2, NAA: NOT DETECTED

## 2019-02-28 NOTE — Telephone Encounter (Signed)
Negative COVID results given. Patient results "NOT Detected." Caller expressed understanding. ° °

## 2019-04-27 IMAGING — CR DG CHEST 2V
1 series · 2 of 2 positions shown · non-contrast
Comparison: February 12, 2016.

CLINICAL DATA: Cough for 3 weeks.  Tobacco use.

EXAM:
CHEST  2 VIEW

[Series 1: w chest pa · 0.14mm/px · 2 of 2 slices shown]
[im 1/2]
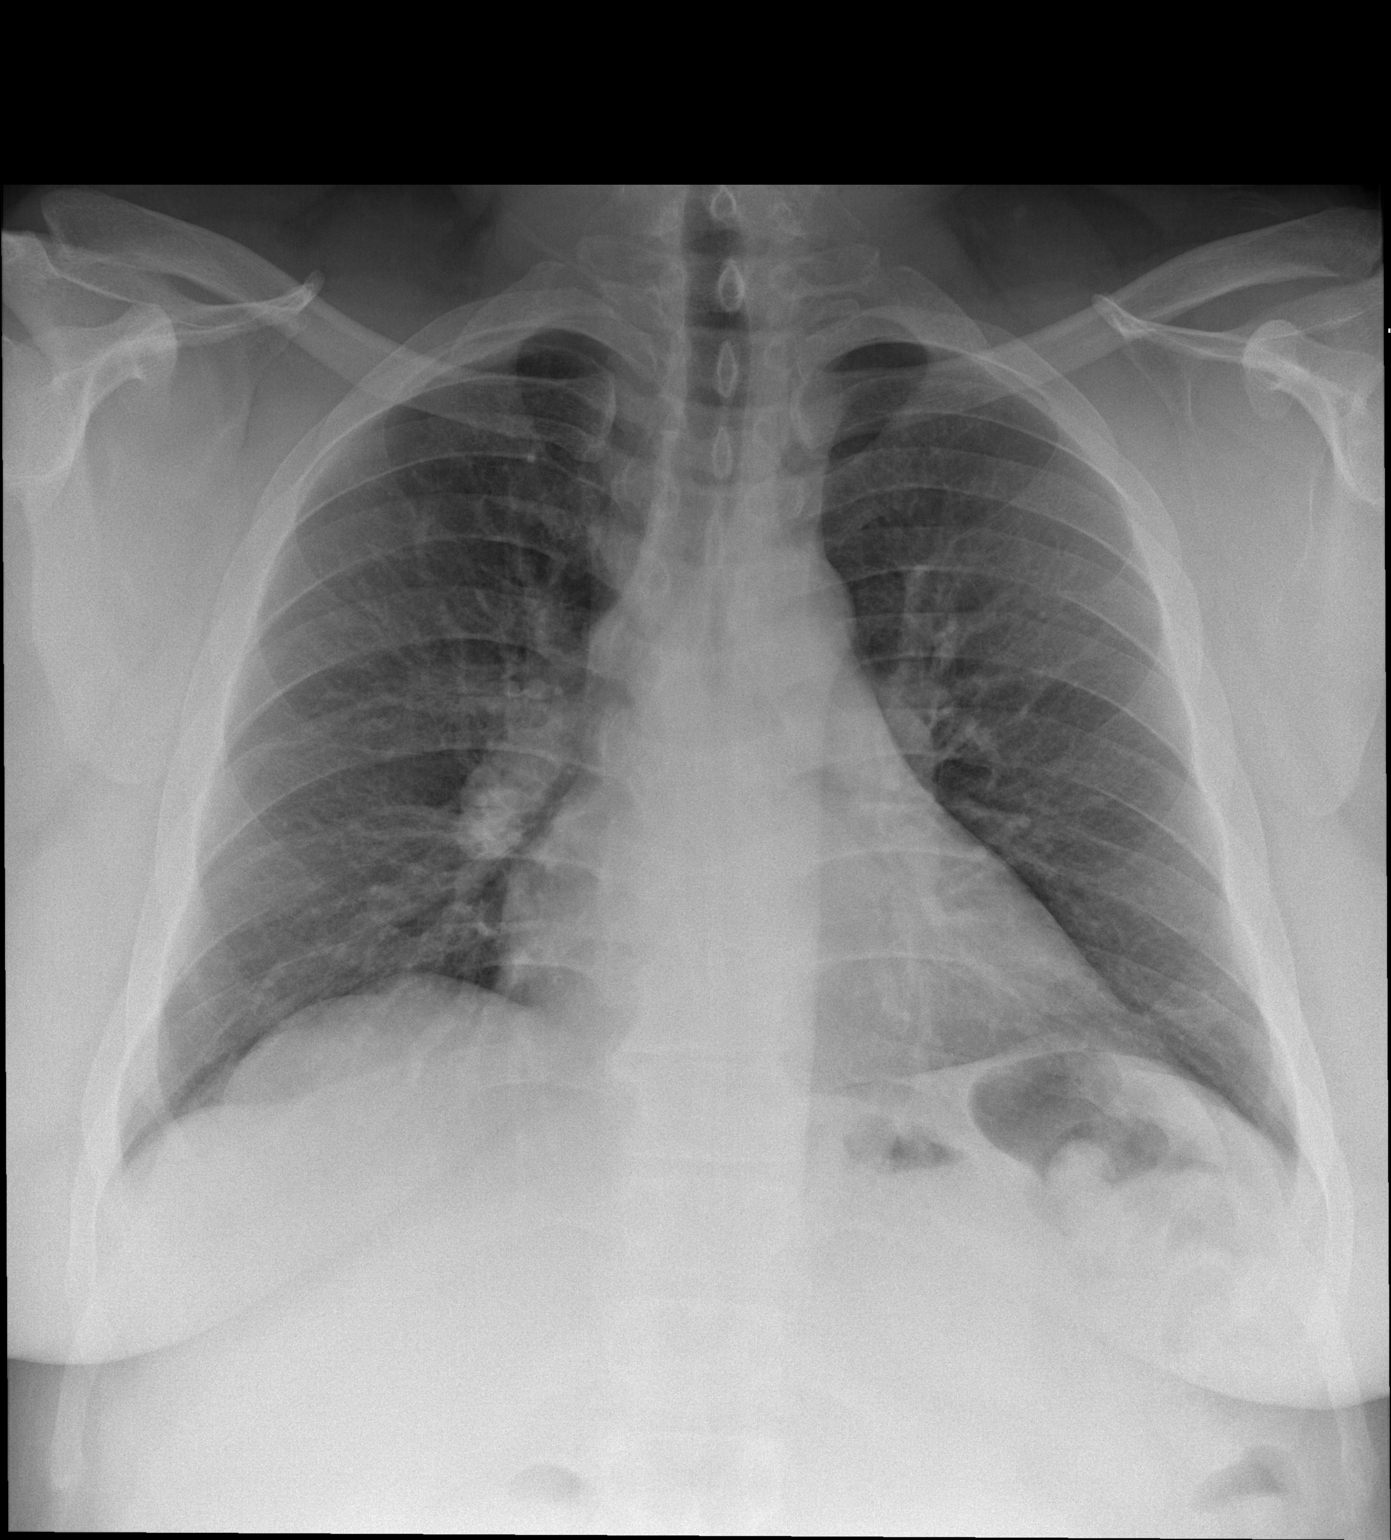
[im 2/2]
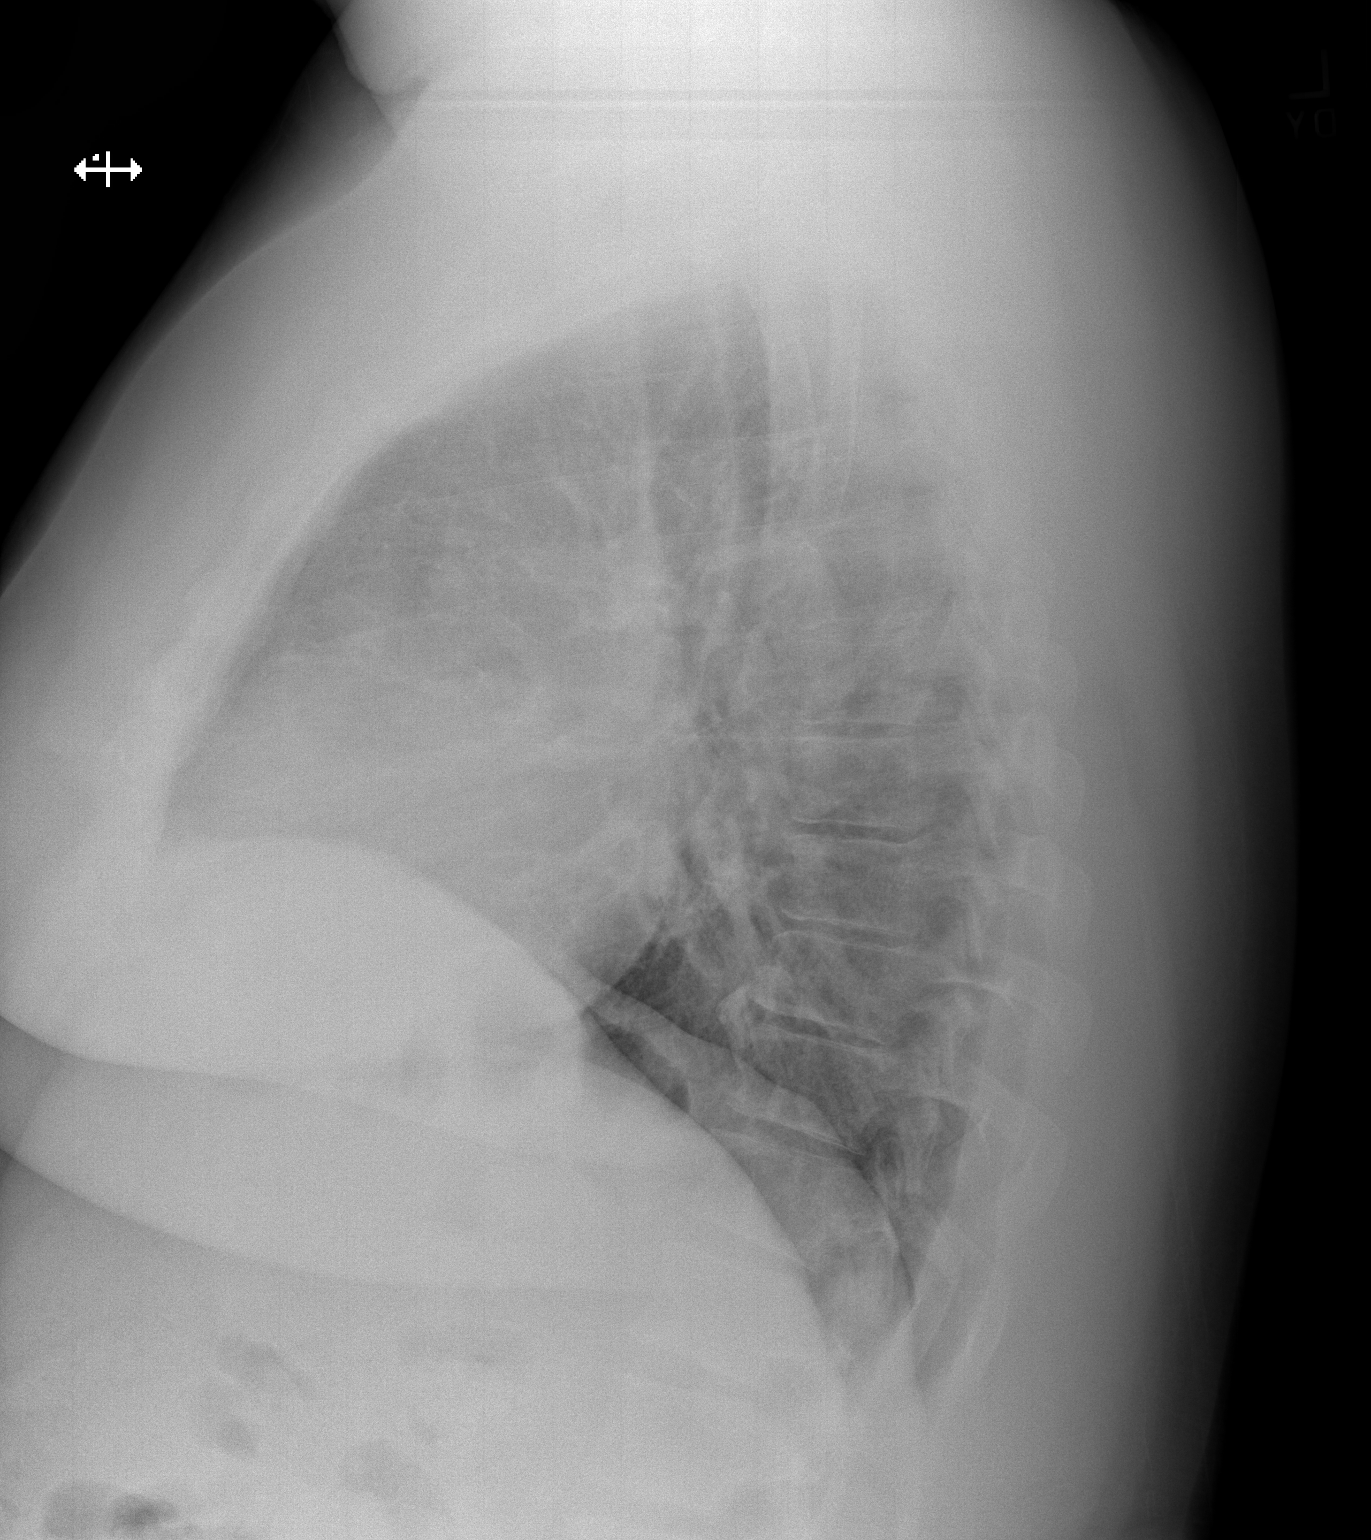

[2 of 2 positions shown; findings below may reference images not displayed]

FINDINGS: There is no edema or consolidation. The heart size and pulmonary
vascularity are normal. No adenopathy. No bone lesions.
IMPRESSION: No edema or consolidation.

## 2019-06-02 ENCOUNTER — Other Ambulatory Visit: Payer: Self-pay

## 2019-06-02 ENCOUNTER — Emergency Department: Payer: Self-pay

## 2019-06-02 ENCOUNTER — Encounter: Payer: Self-pay | Admitting: Emergency Medicine

## 2019-06-02 ENCOUNTER — Emergency Department
Admission: EM | Admit: 2019-06-02 | Discharge: 2019-06-02 | Disposition: A | Discharge status: Home / Self Care | Payer: Self-pay | Attending: Emergency Medicine | Admitting: Emergency Medicine

## 2019-06-02 DIAGNOSIS — Z79899 Other long term (current) drug therapy: Secondary | ICD-10-CM | POA: Insufficient documentation

## 2019-06-02 DIAGNOSIS — F17210 Nicotine dependence, cigarettes, uncomplicated: Secondary | ICD-10-CM | POA: Insufficient documentation

## 2019-06-02 DIAGNOSIS — J40 Bronchitis, not specified as acute or chronic: Secondary | ICD-10-CM | POA: Insufficient documentation

## 2019-06-02 DIAGNOSIS — Z20822 Contact with and (suspected) exposure to covid-19: Secondary | ICD-10-CM | POA: Insufficient documentation

## 2019-06-02 DIAGNOSIS — I110 Hypertensive heart disease with heart failure: Secondary | ICD-10-CM | POA: Insufficient documentation

## 2019-06-02 DIAGNOSIS — I509 Heart failure, unspecified: Secondary | ICD-10-CM | POA: Insufficient documentation

## 2019-06-02 LAB — CBC
HCT: 44.8 % (ref 39.0–52.0)
Hemoglobin: 14.3 g/dL (ref 13.0–17.0)
MCH: 29.4 pg (ref 26.0–34.0)
MCHC: 31.9 g/dL (ref 30.0–36.0)
MCV: 92.2 fL (ref 80.0–100.0)
Platelets: 344 10*3/uL (ref 150–400)
RBC: 4.86 MIL/uL (ref 4.22–5.81)
RDW: 12.9 % (ref 11.5–15.5)
WBC: 13.8 10*3/uL — ABNORMAL HIGH (ref 4.0–10.5)
nRBC: 0 % (ref 0.0–0.2)

## 2019-06-02 LAB — BASIC METABOLIC PANEL
Anion gap: 9 (ref 5–15)
BUN: 14 mg/dL (ref 6–20)
CO2: 27 mmol/L (ref 22–32)
Calcium: 9 mg/dL (ref 8.9–10.3)
Chloride: 102 mmol/L (ref 98–111)
Creatinine, Ser: 1.35 mg/dL — ABNORMAL HIGH (ref 0.61–1.24)
GFR calc Af Amer: >60 mL/min (ref >60)
GFR calc non Af Amer: >60 mL/min (ref >60)
Glucose, Bld: 101 mg/dL — ABNORMAL HIGH (ref 70–99)
Potassium: 4.1 mmol/L (ref 3.5–5.1)
Sodium: 138 mmol/L (ref 135–145)

## 2019-06-02 LAB — POC SARS CORONAVIRUS 2 AG: SARS Coronavirus 2 Ag: NEGATIVE

## 2019-06-02 LAB — BRAIN NATRIURETIC PEPTIDE: B Natriuretic Peptide: 78 pg/mL (ref 0.0–100.0)

## 2019-06-02 LAB — TROPONIN I (HIGH SENSITIVITY): Troponin I (High Sensitivity): 4 ng/L (ref <18)

## 2019-06-02 MED ORDER — ALBUTEROL SULFATE (2.5 MG/3ML) 0.083% IN NEBU
2.5000 mg | INHALATION_SOLUTION | Freq: Once | RESPIRATORY_TRACT | Status: AC
  Administered 2019-06-02: 2.5 mg via RESPIRATORY_TRACT
  Filled 2019-06-02: qty 3

## 2019-06-02 MED ORDER — ALBUTEROL SULFATE HFA 108 (90 BASE) MCG/ACT IN AERS: 2.0000 | INHALATION_SPRAY | RESPIRATORY_TRACT | 0 refills | Status: DC | PRN

## 2019-06-02 MED ORDER — DEXAMETHASONE SODIUM PHOSPHATE 10 MG/ML IJ SOLN
10.0000 mg | Freq: Once | INTRAMUSCULAR | Status: AC
  Administered 2019-06-02: 10 mg via INTRAMUSCULAR
  Filled 2019-06-02: qty 1

## 2019-06-02 MED ORDER — SODIUM CHLORIDE 0.9% FLUSH: 3.0000 mL | Freq: Once | INTRAVENOUS | Status: DC

## 2019-06-02 MED ORDER — PREDNISONE 50 MG PO TABS: 50.0000 mg | ORAL_TABLET | Freq: Every day | ORAL | 0 refills | Status: DC

## 2019-06-02 NOTE — ED Triage Notes (Signed)
C/O wheezing to 3-4 days ago.  Sates he has history of CHF and symtpoms are similar to when he was diagnosed with CHF.  C/O feeling bloated.    Also c/o throat being itching and ears itchy bilaterally.  AAOx3.  Skin warm and dry. NAD

## 2019-06-02 NOTE — ED Provider Notes (Signed)
[facility] Emergency Department Provider Note  ____________________________________________  Time seen: Approximately 4:59 PM  I have reviewed the triage vital signs and the nursing notes.   HISTORY  Chief Complaint Wheezing    HPI Michael Ross is a 49 y.o. male who presents the emergency department complaining of bilateral ear pain, mild nasal congestion, sore throat, mild cough, shortness of breath.  Patient is concerned as he has a history of CHF but has been taking his at home prescriptions as directed.  Patient states that he is having some chest tightness but no substernal chest pain.  He denies any headache, visual changes, neck pain or stiffness, fevers or chills, frank shortness of breath at rest, abdominal pain, nausea vomiting.   Patient has had a positive Covid exposure but states that he tested negative once after that exposure.  No other complaints at this time.  Patient with a history of hypertension and CHF.  No increased peripheral edema reported by patient.        Past Medical History:  Diagnosis Date  . CHF (congestive heart failure) ([place])   . Hypertension     There are no problems to display for this patient.   Past Surgical History:  Procedure Laterality Date  . CARDIAC CATHETERIZATION      Prior to Admission medications   Medication Sig Start Date End Date Taking? Authorizing Provider  albuterol (VENTOLIN HFA) 108 (90 Base) MCG/ACT inhaler Inhale 2 puffs into the lungs every 4 (four) hours as needed for wheezing or shortness of breath. 06/02/19   Odean Mcelwain, Charline Bills, PA-C  atorvastatin (LIPITOR) 10 MG tablet Take 10 mg by mouth daily. 04/15/16 05/15/16  [provider]  furosemide (LASIX) 40 MG tablet Take 40 mg by mouth daily. 04/16/16 05/16/16  [provider]  Gentamicin Sulfate (GENTAMICIN FORTIFIED) 7 ml SOLN Place 0.1 mLs (2 drops total) into the right eye every 4 (four) hours. While awake 12/03/18    Johnn Hai, PA-C  naphazoline-pheniramine (NAPHCON-A) 0.025-0.3 % ophthalmic solution Place 1 drop into the right eye 4 (four) times daily as needed for eye irritation. 12/05/18   Sable Feil, PA-C  predniSONE (DELTASONE) 50 MG tablet Take 1 tablet (50 mg total) by mouth daily with breakfast. 06/02/19   Yusuke Beza, Charline Bills, PA-C    Allergies Lisinopril  No family history on file.  Social History Social History   Tobacco Use  . Smoking status: Current Every Day Smoker    Packs/day: 0.50    Types: Cigarettes    Last attempt to quit: 11/09/2015    Years since quitting: 3.5  . Smokeless tobacco: Never Used  Substance Use Topics  . Alcohol use: Yes    Comment: 1-2 beers twice a week  . Drug use: No     Review of Systems  Constitutional: No fever/chills Eyes: No visual changes. No discharge ENT: Bilateral ear pain,  nasal congestion and sore throat Cardiovascular: no chest pain. Respiratory: Positive cough.  Mild exertional SOB. Gastrointestinal: No abdominal pain.  No nausea, no vomiting.  No diarrhea.  No constipation. Musculoskeletal: Negative for musculoskeletal pain. Skin: Negative for rash, abrasions, lacerations, ecchymosis. Neurological: Negative for headaches, focal weakness or numbness. 10-point ROS otherwise negative.  ____________________________________________   PHYSICAL EXAM:  VITAL SIGNS: ED Triage Vitals  Enc Vitals Group     BP 06/02/19 1425 (!) 153/85     Pulse Rate 06/02/19 1425 71     Resp 06/02/19 1425 16  Temp 06/02/19 1425 99.3 F (37.4 C)     Temp Source 06/02/19 1425 Oral     SpO2 06/02/19 1425 95 %     Weight 06/02/19 1421 257 lb 0.9 oz (116.6 kg)     Height 06/02/19 1421 5\' 5"  (1.651 m)     Head Circumference --      Peak Flow --      Pain Score 06/02/19 1421 0     Pain Loc --      Pain Edu? --      Excl. in [place]? --      Constitutional: Alert and oriented. Well appearing and in no acute distress. Eyes: Conjunctivae  are normal. PERRL. EOMI. Head: Atraumatic. ENT:      Ears: EACs and TMs unremarkable bilaterally.      Nose: No congestion/rhinnorhea.      Mouth/Throat: Mucous membranes are moist.  No oropharyngeal erythema or edema.  Uvula is midline. Neck: No stridor.  Neck is supple full range of motion Hematological/Lymphatic/Immunilogical: No cervical lymphadenopathy. Cardiovascular: Normal rate, regular rhythm. Normal S1 and S2.  Good peripheral circulation. Respiratory: Normal respiratory effort without tachypnea or retractions. Lungs with a few scattered expiratory wheezes.  No inspiratory wheezing.  No rales or rhonchi.Kermit Balo air entry to the bases with no decreased or absent breath sounds. Musculoskeletal: Full range of motion to all extremities. No gross deformities appreciated. Neurologic:  Normal speech and language. No gross focal neurologic deficits are appreciated.  Skin:  Skin is warm, dry and intact. No rash noted. Psychiatric: Mood and affect are normal. Speech and behavior are normal. Patient exhibits appropriate insight and judgement.   ____________________________________________   LABS (all labs ordered are listed, but only abnormal results are displayed)  Labs Reviewed  BASIC METABOLIC PANEL - Abnormal; Notable for the following components:      Result Value   Glucose, Bld 101 (*)    Creatinine, Ser 1.35 (*)    All other components within normal limits  CBC - Abnormal; Notable for the following components:   WBC 13.8 (*)    All other components within normal limits  BRAIN NATRIURETIC PEPTIDE  TROPONIN I (HIGH SENSITIVITY)  TROPONIN I (HIGH SENSITIVITY)   ____________________________________________  EKG   ____________________________________________  RADIOLOGY I personally viewed and evaluated these images as part of my medical decision making, as well as reviewing the written report by the radiologist.  DG Chest 2 View  Result Date: 06/02/2019 CLINICAL DATA:   Wheezing for 3-4 days EXAM: CHEST - 2 VIEW COMPARISON:  Radiograph 12/10/2018 FINDINGS: Lung volumes are diminished with some streaky atelectatic changes in the bases. Mild airways thickening is noted. No pneumothorax or effusion. The cardiomediastinal contours are unremarkable. No acute osseous or soft tissue abnormality. Degenerative changes are present in the imaged spine and shoulders. IMPRESSION: Mild airways thickening could reflect reactive airways disease or bronchitis. Electronically Signed   By: Lovena Le M.D.   On: 06/02/2019 15:39    ____________________________________________    PROCEDURES  Procedure(s) performed:    Procedures    Medications  sodium chloride flush (NS) 0.9 % injection 3 mL (has no administration in time range)  dexamethasone (DECADRON) injection 10 mg (has no administration in time range)  albuterol (PROVENTIL) (2.5 MG/3ML) 0.083% nebulizer solution 2.5 mg (has no administration in time range)     ____________________________________________   INITIAL IMPRESSION / ASSESSMENT AND PLAN / ED COURSE  Pertinent labs & imaging results that were available during my care of  the patient were reviewed by me and considered in my medical decision making (see chart for details).  Review of the [place] CSRS was performed in accordance of the [place] prior to dispensing any controlled drugs.           Patient's diagnosis is consistent with bronchitis, suspected COVID-19.  Patient presented to emergency department with several symptoms consistent with viral URI.  Patient did have a positive COVID-19 exposure but states that he is tested negative after this exposure.  Patient was concerned that he may have a CHF exacerbation.  BNP, labs, chest x-ray with no evidence of significant fluid overload or pulmonary edema.  Based off exam, symptoms, I suspect viral illness causing bronchitis-like symptoms.  This is confirmed with imaging on chest x-ray to show mild  peribronchial thickening.  I recommended repeat COVID-19 test, but patient declines as he states that the swab was "too painful."  I have given patient instructions to self quarantine until asymptomatic for at least 4 days.  Patient will be treated with Decadron, albuterol here in the emergency department.. Patient will be discharged home with prescriptions for prednisone, albuterol outpatient.. Patient is to follow up with primary care as needed or otherwise directed. Patient is given ED precautions to return to the ED for any worsening or new symptoms.     ____________________________________________  FINAL CLINICAL IMPRESSION(S) / ED DIAGNOSES  Final diagnoses:  Bronchitis  Suspected COVID-19 virus infection      NEW MEDICATIONS STARTED DURING THIS VISIT:  ED Discharge Orders         Ordered    predniSONE (DELTASONE) 50 MG tablet  Daily with breakfast     06/02/19 1735    albuterol (VENTOLIN HFA) 108 (90 Base) MCG/ACT inhaler  Every 4 hours PRN     06/02/19 1735              This chart was dictated using voice recognition software/Dragon. Despite best efforts to proofread, errors can occur which can change the meaning. Any change was purely unintentional.    Darletta Moll, PA-C 06/02/19 1737    Duffy Bruce, MD 06/07/19 1500

## 2019-06-03 LAB — SARS CORONAVIRUS 2 (TAT 6-24 HRS): SARS Coronavirus 2: NEGATIVE

## 2019-06-21 ENCOUNTER — Emergency Department
Admission: EM | Admit: 2019-06-21 | Discharge: 2019-06-21 | Disposition: A | Discharge status: Home / Self Care | Payer: Self-pay | Attending: Student | Admitting: Student

## 2019-06-21 ENCOUNTER — Emergency Department: Payer: Self-pay

## 2019-06-21 ENCOUNTER — Other Ambulatory Visit: Payer: Self-pay

## 2019-06-21 ENCOUNTER — Encounter: Payer: Self-pay | Admitting: Emergency Medicine

## 2019-06-21 DIAGNOSIS — J209 Acute bronchitis, unspecified: Secondary | ICD-10-CM | POA: Insufficient documentation

## 2019-06-21 DIAGNOSIS — I509 Heart failure, unspecified: Secondary | ICD-10-CM | POA: Insufficient documentation

## 2019-06-21 DIAGNOSIS — I110 Hypertensive heart disease with heart failure: Secondary | ICD-10-CM | POA: Insufficient documentation

## 2019-06-21 DIAGNOSIS — F17210 Nicotine dependence, cigarettes, uncomplicated: Secondary | ICD-10-CM | POA: Insufficient documentation

## 2019-06-21 LAB — CBC WITH DIFFERENTIAL/PLATELET
Abs Immature Granulocytes: 0.04 10*3/uL (ref 0.00–0.07)
Basophils Absolute: 0.1 10*3/uL (ref 0.0–0.1)
Basophils Relative: 1 %
Eosinophils Absolute: 0.6 10*3/uL — ABNORMAL HIGH (ref 0.0–0.5)
Eosinophils Relative: 5 %
HCT: 41.8 % (ref 39.0–52.0)
Hemoglobin: 13.5 g/dL (ref 13.0–17.0)
Immature Granulocytes: 0 %
Lymphocytes Relative: 18 %
Lymphs Abs: 2 10*3/uL (ref 0.7–4.0)
MCH: 29.5 pg (ref 26.0–34.0)
MCHC: 32.3 g/dL (ref 30.0–36.0)
MCV: 91.3 fL (ref 80.0–100.0)
Monocytes Absolute: 0.7 10*3/uL (ref 0.1–1.0)
Monocytes Relative: 6 %
Neutro Abs: 7.8 10*3/uL — ABNORMAL HIGH (ref 1.7–7.7)
Neutrophils Relative %: 70 %
Platelets: 317 10*3/uL (ref 150–400)
RBC: 4.58 MIL/uL (ref 4.22–5.81)
RDW: 12.8 % (ref 11.5–15.5)
WBC: 11.2 10*3/uL — ABNORMAL HIGH (ref 4.0–10.5)
nRBC: 0 % (ref 0.0–0.2)

## 2019-06-21 LAB — COMPREHENSIVE METABOLIC PANEL
ALT: 27 U/L (ref 0–44)
AST: 27 U/L (ref 15–41)
Albumin: 3.6 g/dL (ref 3.5–5.0)
Alkaline Phosphatase: 75 U/L (ref 38–126)
Anion gap: 10 (ref 5–15)
BUN: 19 mg/dL (ref 6–20)
CO2: 28 mmol/L (ref 22–32)
Calcium: 8.5 mg/dL — ABNORMAL LOW (ref 8.9–10.3)
Chloride: 99 mmol/L (ref 98–111)
Creatinine, Ser: 1.33 mg/dL — ABNORMAL HIGH (ref 0.61–1.24)
GFR calc Af Amer: >60 mL/min (ref >60)
GFR calc non Af Amer: >60 mL/min (ref >60)
Glucose, Bld: 95 mg/dL (ref 70–99)
Potassium: 3.5 mmol/L (ref 3.5–5.1)
Sodium: 137 mmol/L (ref 135–145)
Total Bilirubin: 0.8 mg/dL (ref 0.3–1.2)
Total Protein: 8.2 g/dL — ABNORMAL HIGH (ref 6.5–8.1)

## 2019-06-21 LAB — TROPONIN I (HIGH SENSITIVITY): Troponin I (High Sensitivity): 5 ng/L (ref <18)

## 2019-06-21 LAB — BRAIN NATRIURETIC PEPTIDE: B Natriuretic Peptide: 44 pg/mL (ref 0.0–100.0)

## 2019-06-21 MED ORDER — HYDROCOD POLST-CPM POLST ER 10-8 MG/5ML PO SUER: 5.0000 mL | Freq: Two times a day (BID) | ORAL | 0 refills | Status: DC | PRN

## 2019-06-21 MED ORDER — BENZONATATE 200 MG PO CAPS: 200.0000 mg | ORAL_CAPSULE | Freq: Three times a day (TID) | ORAL | 0 refills | Status: DC | PRN

## 2019-06-21 MED ORDER — AZITHROMYCIN 250 MG PO TABS: ORAL_TABLET | ORAL | 0 refills | Status: DC

## 2019-06-21 NOTE — ED Triage Notes (Signed)
Pt reports a cough for the past several weeks. Pt reports sometimes it is productive with yellowish phlem. Pt denies fevers, pain and SOB.

## 2019-06-21 NOTE — ED Provider Notes (Signed)
[facility] Emergency Department Provider Note  ____________________________________________   First MD Initiated Contact with Patient 06/21/19 1310     (approximate)  I have reviewed the triage vital signs and the nursing notes.   HISTORY  Chief Complaint Cough    HPI Michael Ross is a 49 y.o. male has history of CHF presents to the emergency department with continued cough.  Was told he had bronchitis few weeks ago.  States the cough is very harsh and similar to when he had congestive heart failure.  He denies fever or chills.  He denies any chest pain.  No new swelling in the extremities.  States he is taking his Lasix as prescribed.    Past Medical History:  Diagnosis Date  . CHF (congestive heart failure) ([place])   . Hypertension     There are no problems to display for this patient.   Past Surgical History:  Procedure Laterality Date  . CARDIAC CATHETERIZATION      Prior to Admission medications   Medication Sig Start Date End Date Taking? Authorizing Provider  albuterol (VENTOLIN HFA) 108 (90 Base) MCG/ACT inhaler Inhale 2 puffs into the lungs every 4 (four) hours as needed for wheezing or shortness of breath. 06/02/19   Cuthriell, Charline Bills, PA-C  atorvastatin (LIPITOR) 10 MG tablet Take 10 mg by mouth daily. 04/15/16 05/15/16  [provider]  azithromycin (ZITHROMAX Z-PAK) 250 MG tablet 2 pills today then 1 pill a day for 4 days 06/21/19   Caryn Section Linden Dolin, PA-C  benzonatate (TESSALON) 200 MG capsule Take 1 capsule (200 mg total) by mouth 3 (three) times daily as needed for cough. 06/21/19   Caryn Section Linden Dolin, PA-C  chlorpheniramine-HYDROcodone (TUSSIONEX PENNKINETIC ER) 10-8 MG/5ML SUER Take 5 mLs by mouth every 12 (twelve) hours as needed for cough. 06/21/19   Artis Beggs, Linden Dolin, PA-C  furosemide (LASIX) 40 MG tablet Take 40 mg by mouth daily. 04/16/16 05/16/16  [provider]    Allergies Lisinopril  No family history on  file.  Social History Social History   Tobacco Use  . Smoking status: Current Every Day Smoker    Packs/day: 0.50    Types: Cigarettes    Last attempt to quit: 11/09/2015    Years since quitting: 3.6  . Smokeless tobacco: Never Used  Substance Use Topics  . Alcohol use: Yes    Comment: 1-2 beers twice a week  . Drug use: No    Review of Systems  Constitutional: No fever/chills Eyes: No visual changes. ENT: No sore throat. Respiratory: Positive cough Cardiovascular: Denies chest pain Gastrointestinal: Denies abdominal pain Genitourinary: Negative for dysuria. Musculoskeletal: Negative for back pain. Skin: Negative for rash. Psychiatric: no mood changes,     ____________________________________________   PHYSICAL EXAM:  VITAL SIGNS: ED Triage Vitals  Enc Vitals Group     BP 06/21/19 1308 (!) 108/54     Pulse Rate 06/21/19 1308 77     Resp 06/21/19 1308 16     Temp 06/21/19 1308 98.3 F (36.8 C)     Temp Source 06/21/19 1308 Oral     SpO2 06/21/19 1308 95 %     Weight 06/21/19 1300 257 lb (116.6 kg)     Height 06/21/19 1300 5' 5"  (1.651 m)     Head Circumference --      Peak Flow --      Pain Score 06/21/19 1300 0     Pain Loc --  Pain Edu? --      Excl. in [place]? --     Constitutional: Alert and oriented. Well appearing and in no acute distress. Eyes: Conjunctivae are normal.  Head: Atraumatic. Nose: No congestion/rhinnorhea. Mouth/Throat: Mucous membranes are moist.   Neck:  supple no lymphadenopathy noted Cardiovascular: Normal rate, regular rhythm. Heart sounds are normal Respiratory: Normal respiratory effort.  No retractions, lungs c t a  GU: deferred Musculoskeletal: FROM all extremities, warm and well perfused Neurologic:  Normal speech and language.  Skin:  Skin is warm, dry and intact. No rash noted. Psychiatric: Mood and affect are normal. Speech and behavior are normal.  ____________________________________________   LABS (all labs  ordered are listed, but only abnormal results are displayed)  Labs Reviewed  CBC WITH DIFFERENTIAL/PLATELET - Abnormal; Notable for the following components:      Result Value   WBC 11.2 (*)    Neutro Abs 7.8 (*)    Eosinophils Absolute 0.6 (*)    All other components within normal limits  COMPREHENSIVE METABOLIC PANEL - Abnormal; Notable for the following components:   Creatinine, Ser 1.33 (*)    Calcium 8.5 (*)    Total Protein 8.2 (*)    All other components within normal limits  BRAIN NATRIURETIC PEPTIDE  TROPONIN I (HIGH SENSITIVITY)  TROPONIN I (HIGH SENSITIVITY)   ____________________________________________   ____________________________________________  RADIOLOGY  Chest x-ray is read as normal by the radiologist however feel like the area on the left side of the heart is very blunted.  Have concerns that he is in early congestive heart failure  ____________________________________________   PROCEDURES  Procedure(s) performed: Saline lock   Procedures    ____________________________________________   INITIAL IMPRESSION / ASSESSMENT AND PLAN / ED COURSE  Pertinent labs & imaging results that were available during my care of the patient were reviewed by me and considered in my medical decision making (see chart for details).   Patient is 49 year old male presents emergency department with history of CHF and cough.  See HPI  Physical exam patient does have a harsh hacking cough.  Lungs are clear to auscultation.  No pedal edema is noted  Ddx: covid, bronchitis, cap, chf  Chest x-ray is read as normal from the radiologist.  However the left side of the heart appears to be blunted and is an early heart failure.  Saline lock, CBC, met C, troponin, BNP, EKG ordered    ----------------------------------------- 4:12 PM on 06/21/2019 -----------------------------------------  EKG shows normal sinus rhythm, BNP is normal, comprehensive metabolic panel is  basically normal, CBC has elevated neutrophils and WBC.  Chest x-ray did show some blunting on the left side but was read as normal.  I explained all the findings to the patient.  He was given a prescription for Z-Pak, Tessalon Perles for cough during the day, and Tussionex for cough at night.  He is to return the emergency department if worsening.  Continue to take his Lasix.  Use inhaler as needed.  He was discharged in stable condition with a work note for today.  Michael Ross was evaluated in Emergency Department on 06/21/2019 for the symptoms described in the history of present illness. He was evaluated in the context of the global COVID-19 pandemic, which necessitated consideration that the patient might be at risk for infection with the SARS-CoV-2 virus that causes COVID-19. Institutional protocols and algorithms that pertain to the evaluation of patients at risk for COVID-19 are in a state of rapid change based on  information released by regulatory bodies including the CDC and federal and state organizations. These policies and algorithms were followed during the patient's care in the ED.   As part of my medical decision making, I reviewed the following data within the [place] notes reviewed and incorporated, Labs reviewed , EKG interpreted NSR, Old chart reviewed, Radiograph reviewed chest x-ray normal, Notes from prior ED visits and [place] Controlled Substance Database  ____________________________________________   FINAL CLINICAL IMPRESSION(S) / ED DIAGNOSES  Final diagnoses:  Acute bronchitis, unspecified organism      NEW MEDICATIONS STARTED DURING THIS VISIT:  New Prescriptions   AZITHROMYCIN (ZITHROMAX Z-PAK) 250 MG TABLET    2 pills today then 1 pill a day for 4 days   BENZONATATE (TESSALON) 200 MG CAPSULE    Take 1 capsule (200 mg total) by mouth 3 (three) times daily as needed for cough.   CHLORPHENIRAMINE-HYDROCODONE (TUSSIONEX PENNKINETIC ER) 10-8  MG/5ML SUER    Take 5 mLs by mouth every 12 (twelve) hours as needed for cough.     Note:  This document was prepared using Dragon voice recognition software and may include unintentional dictation errors.    Versie Starks, PA-C 06/21/19 1614    Lilia Pro., MD 06/21/19 [phone]

## 2019-06-21 NOTE — Discharge Instructions (Signed)
Follow-up with your regular doctor if not better in 3 days.  Return emergency department worsening. Use Tessalon Perles during the day for cough.  Tussionex at night for cough. Use the Z-Pak for infection. Continue to use your albuterol inhaler and your regular medications.

## 2020-07-14 ENCOUNTER — Emergency Department
Admission: EM | Admit: 2020-07-14 | Discharge: 2020-07-14 | Disposition: A | Discharge status: Home / Self Care | Payer: Self-pay | Attending: Emergency Medicine | Admitting: Emergency Medicine

## 2020-07-14 ENCOUNTER — Other Ambulatory Visit: Payer: Self-pay

## 2020-07-14 ENCOUNTER — Emergency Department: Payer: Self-pay

## 2020-07-14 DIAGNOSIS — Z20822 Contact with and (suspected) exposure to covid-19: Secondary | ICD-10-CM | POA: Insufficient documentation

## 2020-07-14 DIAGNOSIS — I509 Heart failure, unspecified: Secondary | ICD-10-CM | POA: Insufficient documentation

## 2020-07-14 DIAGNOSIS — J40 Bronchitis, not specified as acute or chronic: Secondary | ICD-10-CM | POA: Insufficient documentation

## 2020-07-14 DIAGNOSIS — I110 Hypertensive heart disease with heart failure: Secondary | ICD-10-CM | POA: Insufficient documentation

## 2020-07-14 DIAGNOSIS — F17210 Nicotine dependence, cigarettes, uncomplicated: Secondary | ICD-10-CM | POA: Insufficient documentation

## 2020-07-14 DIAGNOSIS — Z79899 Other long term (current) drug therapy: Secondary | ICD-10-CM | POA: Insufficient documentation

## 2020-07-14 LAB — TROPONIN I (HIGH SENSITIVITY): Troponin I (High Sensitivity): 7 ng/L (ref <18)

## 2020-07-14 LAB — CBC WITH DIFFERENTIAL/PLATELET
Abs Immature Granulocytes: 0.06 10*3/uL (ref 0.00–0.07)
Basophils Absolute: 0.1 10*3/uL (ref 0.0–0.1)
Basophils Relative: 1 %
Eosinophils Absolute: 0.2 10*3/uL (ref 0.0–0.5)
Eosinophils Relative: 2 %
HCT: 45.5 % (ref 39.0–52.0)
Hemoglobin: 14.5 g/dL (ref 13.0–17.0)
Immature Granulocytes: 0 %
Lymphocytes Relative: 20 %
Lymphs Abs: 2.9 10*3/uL (ref 0.7–4.0)
MCH: 29.1 pg (ref 26.0–34.0)
MCHC: 31.9 g/dL (ref 30.0–36.0)
MCV: 91.2 fL (ref 80.0–100.0)
Monocytes Absolute: 0.9 10*3/uL (ref 0.1–1.0)
Monocytes Relative: 7 %
Neutro Abs: 10.1 10*3/uL — ABNORMAL HIGH (ref 1.7–7.7)
Neutrophils Relative %: 70 %
Platelets: 335 10*3/uL (ref 150–400)
RBC: 4.99 MIL/uL (ref 4.22–5.81)
RDW: 12.6 % (ref 11.5–15.5)
WBC: 14.3 10*3/uL — ABNORMAL HIGH (ref 4.0–10.5)
nRBC: 0 % (ref 0.0–0.2)

## 2020-07-14 LAB — COMPREHENSIVE METABOLIC PANEL
ALT: 23 U/L (ref 0–44)
AST: 27 U/L (ref 15–41)
Albumin: 3.9 g/dL (ref 3.5–5.0)
Alkaline Phosphatase: 82 U/L (ref 38–126)
Anion gap: 9 (ref 5–15)
BUN: 20 mg/dL (ref 6–20)
CO2: 28 mmol/L (ref 22–32)
Calcium: 9.2 mg/dL (ref 8.9–10.3)
Chloride: 96 mmol/L — ABNORMAL LOW (ref 98–111)
Creatinine, Ser: 1.23 mg/dL (ref 0.61–1.24)
GFR, Estimated: >60 mL/min (ref >60)
Glucose, Bld: 104 mg/dL — ABNORMAL HIGH (ref 70–99)
Potassium: 4.1 mmol/L (ref 3.5–5.1)
Sodium: 133 mmol/L — ABNORMAL LOW (ref 135–145)
Total Bilirubin: 0.7 mg/dL (ref 0.3–1.2)
Total Protein: 8.8 g/dL — ABNORMAL HIGH (ref 6.5–8.1)

## 2020-07-14 LAB — BRAIN NATRIURETIC PEPTIDE: B Natriuretic Peptide: 23.5 pg/mL (ref 0.0–100.0)

## 2020-07-14 MED ORDER — AZITHROMYCIN 250 MG PO TABS: ORAL_TABLET | ORAL | 0 refills | Status: AC

## 2020-07-14 MED ORDER — BENZONATATE 100 MG PO CAPS: 100.0000 mg | ORAL_CAPSULE | Freq: Three times a day (TID) | ORAL | 0 refills | Status: DC | PRN

## 2020-07-14 MED ORDER — PREDNISONE 10 MG PO TABS: 40.0000 mg | ORAL_TABLET | Freq: Every day | ORAL | 0 refills | Status: AC

## 2020-07-14 NOTE — ED Triage Notes (Signed)
Pt states coming in with a cough and shortness of breath for the last 1.5weeks. Pt states only pain with the cough. Pt states history of CHF and this feels like his last episode of CHF flare up a year ago.  Pt states he takes his lasix daily as prescribed.

## 2020-07-14 NOTE — ED Provider Notes (Signed)
[facility] Emergency Department Provider Note  ____________________________________________   Event Date/Time   First MD Initiated Contact with Patient 07/14/20 1535     (approximate)  I have reviewed the triage vital signs and the nursing notes.   HISTORY  Chief Complaint Shortness of Breath and Cough    HPI Michael Ross is a 50 y.o. male with CHF who comes in for SOB and cough. This has been going on one week. Worsening over the past few days. Cough has been intermittent, nothing makes it better or worse. Chest pain with coughing. Has been taking lasix 40 BID. Still urinating but maybe not as much as before.  Patient states that he has an inhaler at home.  Denies any risk factors for PE  On review of records has had visits for bronchitis previously.            Past Medical History:  Diagnosis Date  . CHF (congestive heart failure) ([place])   . Hypertension     There are no problems to display for this patient.   Past Surgical History:  Procedure Laterality Date  . CARDIAC CATHETERIZATION      Prior to Admission medications   Medication Sig Start Date End Date Taking? Authorizing Provider  albuterol (VENTOLIN HFA) 108 (90 Base) MCG/ACT inhaler Inhale 2 puffs into the lungs every 4 (four) hours as needed for wheezing or shortness of breath. 06/02/19   Cuthriell, Charline Bills, PA-C  atorvastatin (LIPITOR) 10 MG tablet Take 10 mg by mouth daily. 04/15/16 05/15/16  [provider]  azithromycin (ZITHROMAX Z-PAK) 250 MG tablet 2 pills today then 1 pill a day for 4 days 06/21/19   Caryn Section Linden Dolin, PA-C  benzonatate (TESSALON) 200 MG capsule Take 1 capsule (200 mg total) by mouth 3 (three) times daily as needed for cough. 06/21/19   Caryn Section Linden Dolin, PA-C  chlorpheniramine-HYDROcodone (TUSSIONEX PENNKINETIC ER) 10-8 MG/5ML SUER Take 5 mLs by mouth every 12 (twelve) hours as needed for cough. 06/21/19   Fisher, Linden Dolin, PA-C  furosemide (LASIX) 40 MG  tablet Take 40 mg by mouth daily. 04/16/16 05/16/16  [provider]    Allergies Lisinopril  No family history on file.  Social History Social History   Tobacco Use  . Smoking status: Current Every Day Smoker    Packs/day: 0.50    Types: Cigarettes    Last attempt to quit: 11/09/2015    Years since quitting: 4.6  . Smokeless tobacco: Never Used  Substance Use Topics  . Alcohol use: Yes    Comment: 1-2 beers twice a week  . Drug use: No      Review of Systems Constitutional: No fever/chills Eyes: No visual changes. ENT: No sore throat. Cardiovascular: No chest pain Respiratory: Positive for SOB, cough  Gastrointestinal: No abdominal pain.  No nausea, no vomiting.  No diarrhea.  No constipation. Genitourinary: Negative for dysuria. Musculoskeletal: Negative for back pain. Skin: Negative for rash. Neurological: Negative for headaches, focal weakness or numbness. All other ROS negative ____________________________________________   PHYSICAL EXAM:  VITAL SIGNS: ED Triage Vitals  Enc Vitals Group     BP 07/14/20 1524 125/78     Pulse Rate 07/14/20 1524 74     Resp 07/14/20 1524 20     Temp 07/14/20 1524 98.7 F (37.1 C)     Temp src --      SpO2 07/14/20 1524 95 %     Weight 07/14/20 1525 250 lb (113.4 kg)  Height 07/14/20 1525 5\' 5"  (1.651 m)     Head Circumference --      Peak Flow --      Pain Score 07/14/20 1525 0     Pain Loc --      Pain Edu? --      Excl. in [place]? --     Constitutional: Alert and oriented. Well appearing and in no acute distress. Eyes: Conjunctivae are normal. EOMI. Head: Atraumatic. Nose: No congestion/rhinnorhea. Mouth/Throat: Mucous membranes are moist.   Neck: No stridor. Trachea Midline. FROM Cardiovascular: Normal rate, regular rhythm. Grossly normal heart sounds.  Good peripheral circulation. Respiratory: no increased WOB, clear lungs  Gastrointestinal: Soft and nontender. No distention. No abdominal bruits.   Musculoskeletal: No lower extremity tenderness nor edema.  No joint effusions. Neurologic:  Normal speech and language. No gross focal neurologic deficits are appreciated.  Skin:  Skin is warm, dry and intact. No rash noted. Psychiatric: Mood and affect are normal. Speech and behavior are normal. GU: Deferred   ____________________________________________   LABS (all labs ordered are listed, but only abnormal results are displayed)  Labs Reviewed  CBC WITH DIFFERENTIAL/PLATELET - Abnormal; Notable for the following components:      Result Value   WBC 14.3 (*)    Neutro Abs 10.1 (*)    All other components within normal limits  COMPREHENSIVE METABOLIC PANEL - Abnormal; Notable for the following components:   Sodium 133 (*)    Chloride 96 (*)    Glucose, Bld 104 (*)    Total Protein 8.8 (*)    All other components within normal limits  SARS CORONAVIRUS 2 (TAT 6-24 HRS)  BRAIN NATRIURETIC PEPTIDE  TROPONIN I (HIGH SENSITIVITY)  TROPONIN I (HIGH SENSITIVITY)   ____________________________________________   ED ECG REPORT I, [name], the attending physician, personally viewed and interpreted this ECG.  Normal sinus rate of 73, no st elevation, twi in 3,AVF, normal intervals   Inversions previously in 3, avf  ____________________________________________  RADIOLOGY I, Vanessa [place], personally viewed and evaluated these images (plain radiographs) as part of my medical decision making, as well as reviewing the written report by the radiologist.  ED MD interpretation: Airway thickening  Official radiology report(s): DG Chest 2 View  Result Date: 07/14/2020 CLINICAL DATA:  Shortness of breath over the past 1.5 weeks EXAM: CHEST - 2 VIEW COMPARISON:  06/21/2019 FINDINGS: Thoracic spondylosis. Ill-defined density projecting over the left ninth rib posterolaterally, possibly from scarring but a true nodule is difficult to exclude. This measures about 1.6 by 1.0 cm. No blunting  of the costophrenic angles. Airway thickening is present, suggesting bronchitis or reactive airways disease. Upper normal heart size. IMPRESSION: 1. Airway thickening is present, suggesting bronchitis or reactive airways disease. 2. Ill-defined density projecting over the left ninth rib posterolaterally, possibly from scarring but a true nodule is difficult to exclude. Consider chest CT for further characterization. Electronically Signed   By: Van Clines M.D.   On: 07/14/2020 16:06    ____________________________________________   PROCEDURES  Procedure(s) performed (including Critical Care):  Procedures   ____________________________________________   INITIAL IMPRESSION / ASSESSMENT AND PLAN / ED COURSE   Enoc Getter was evaluated in Emergency Department on 07/14/2020 for the symptoms described in the history of present illness. He was evaluated in the context of the global COVID-19 pandemic, which necessitated consideration that the patient might be at risk for infection with the SARS-CoV-2 virus that causes COVID-19. Institutional protocols and algorithms that  pertain to the evaluation of patients at risk for COVID-19 are in a state of rapid change based on information released by regulatory bodies including the CDC and federal and state organizations. These policies and algorithms were followed during the patient's care in the ED.     Pt presents with SOB.  Patient is a history of bronchitis I suspect that this is most likely the cause of his above symptoms.  Differential includes: PNA-will get xray to evaluation Anemia-CBC to evaluate ACS- will get trops Arrhythmia-Will get EKG and keep on monitor.  COVID- will get testing per algorithm. PE-lower suspicion given no risk factors and other cause more likely, PERC negative  Labs are reassuring.  Heart marker is negative I have low suspicion for ACS given this been ongoing for a while now.  No evidence of edema.   Discussed  with patient the results of the chest x-ray.  Understands that could be a nodule.  Referred him to the pulmonary nodule clinic.  Patient expressed understanding.  We will treat patient with steroids, azithromycin and Tessalon Perles.  At this time patient is very well-appearing with normal vital signs and no increased work of breathing and feels comfortable with discharge home  I discussed the provisional nature of ED diagnosis, the treatment so far, the ongoing plan of care, follow up appointments and return precautions with the patient and any family or support people present. They expressed understanding and agreed with the plan, discharged home.  ____________________________________________   FINAL CLINICAL IMPRESSION(S) / ED DIAGNOSES   Final diagnoses:  Bronchitis     MEDICATIONS GIVEN DURING THIS VISIT:  Medications - No data to display   ED Discharge Orders         Ordered    predniSONE (DELTASONE) 10 MG tablet  Daily        07/14/20 1712    benzonatate (TESSALON PERLES) 100 MG capsule  3 times daily PRN        07/14/20 1712    azithromycin (ZITHROMAX Z-PAK) 250 MG tablet        07/14/20 1712    AMB  Referral to Pulmonary Nodule Clinic        07/14/20 1712           Note:  This document was prepared using Dragon voice recognition software and may include unintentional dictation errors.   Vanessa [place], MD 07/14/20 1725

## 2020-07-14 NOTE — ED Notes (Signed)
EKG completed and given to Dr. Jari Pigg. Provider in triage room with pt

## 2020-07-14 NOTE — Discharge Instructions (Addendum)
Follow-up with the primary nodule clinic or your primary care doctor due to the x-ray as below.  Your Covid test is pending and should be followed up in MyChart.  Take the antibiotics and steroids to help with inflammation.  Continue take your albuterol inhaler every 6 hours.  Return to the ER if develop worsening shortness of breath   1. Airway thickening is present, suggesting bronchitis or reactive  airways disease.  2. Ill-defined density projecting over the left ninth rib  posterolaterally, possibly from scarring but a true nodule is  difficult to exclude. Consider chest CT for further  characterization.

## 2020-07-15 LAB — SARS CORONAVIRUS 2 (TAT 6-24 HRS): SARS Coronavirus 2: NEGATIVE

## 2020-07-16 ENCOUNTER — Other Ambulatory Visit: Payer: Self-pay | Admitting: Oncology

## 2020-07-16 DIAGNOSIS — R059 Cough, unspecified: Secondary | ICD-10-CM

## 2020-07-16 DIAGNOSIS — R9389 Abnormal findings on diagnostic imaging of other specified body structures: Secondary | ICD-10-CM

## 2020-07-16 NOTE — Progress Notes (Signed)
  Pulmonary Nodule Clinic Telephone Note [place]   HPI:Michael Ross is a 50 year old male with past medical history significant for CHF who presented to ED for complaints of shortness of breath and cough.  Work-up included labs and x-ray which were negative for cardiac involvement but did show an ill-defined density over the left ninth rib representing possible scarring but a nodule was hard to exclude.  Radiology recommended a CT scan for further evaluation.  Review and Recommendations: I personally reviewed all patient's previous imaging including most recent chest x-ray from 07/14/2020.  I recommend follow-up with noncontrasted CT scan of the chest ASAP.  Social History: Tobacco Use: High Risk  . Smoking Tobacco Use: Current Every Day Smoker  . Smokeless Tobacco Use: Never Used     High risk factors include: History of heavy smoking, exposure to asbestos, radium or uranium, personal family history of lung cancer, older age, sex (females greater than males), race (black and native [place] greater than weight), marginal speculation, upper lobe location, multiplicity (less than 5 nodules increases risk for malignancy) and emphysema and/or pulmonary fibrosis.   This recommendation follows the consensus statement: Guidelines for Management of Incidental Pulmonary Nodules Detected on CT Images: From the Fleischner Society 2017; Radiology 2017; 284:228-243.    I have placed order for CT scan without contrast to be completed ASAP.  Disposition: Order placed for repeat CT chest. Will notify [place] in scheduling. [place] to call patient with appointment date and time. Return to pulmonary nodule clinic a few days after his repeat imaging to discuss results and plan moving forward.  Faythe Casa, NP 07/16/2020 8:57 AM

## 2020-07-23 ENCOUNTER — Telehealth: Payer: Self-pay | Admitting: *Deleted

## 2020-07-23 ENCOUNTER — Other Ambulatory Visit: Payer: Self-pay | Admitting: Oncology

## 2020-07-23 NOTE — Telephone Encounter (Signed)
Pt made aware of referral to Lung Nodule Clinic from PCP. Pt is in agreement to have upcoming CT scan and follow up as recommended.  Pt has been made aware of upcoming appts for follow up CT scan and follow up appt with Jennifer Burns, NP in the Lung Nodule Clinic. Pt verbalized understanding. Nothing further needed at this time.  

## 2020-07-23 NOTE — Progress Notes (Signed)
error 

## 2020-07-27 ENCOUNTER — Ambulatory Visit: Payer: Self-pay

## 2020-07-29 ENCOUNTER — Inpatient Hospital Stay: Payer: Self-pay | Admitting: Oncology

## 2020-07-29 ENCOUNTER — Telehealth: Payer: Self-pay | Admitting: *Deleted

## 2020-07-29 NOTE — Progress Notes (Signed)
No-show for CT scan.  We will cancel his result appointment.  Faythe Casa, NP 07/29/2020 10:38 AM

## 2020-07-29 NOTE — Telephone Encounter (Signed)
Pt has been made aware of upcoming appts for follow up CT scan and follow up appt with Jennifer Burns, NP in the Lung Nodule Clinic. Pt verbalized understanding. Nothing further needed at this time. 

## 2020-07-31 ENCOUNTER — Ambulatory Visit: Payer: Self-pay

## 2020-07-31 ENCOUNTER — Telehealth: Payer: Self-pay | Admitting: *Deleted

## 2020-07-31 NOTE — Telephone Encounter (Signed)
Pt left message that would like to cancel his appts in the lung nodule clinic. States that he will pursue follow up with his providers at [facility]. Appts cancelled at this time per pt request.

## 2020-08-03 ENCOUNTER — Inpatient Hospital Stay: Payer: Self-pay | Admitting: Oncology

## 2020-09-24 ENCOUNTER — Emergency Department: Payer: Self-pay

## 2020-09-24 ENCOUNTER — Emergency Department
Admission: EM | Admit: 2020-09-24 | Discharge: 2020-09-24 | Disposition: A | Discharge status: Home / Self Care | Payer: Self-pay | Attending: Emergency Medicine | Admitting: Emergency Medicine

## 2020-09-24 ENCOUNTER — Other Ambulatory Visit: Payer: Self-pay

## 2020-09-24 DIAGNOSIS — J209 Acute bronchitis, unspecified: Secondary | ICD-10-CM | POA: Insufficient documentation

## 2020-09-24 DIAGNOSIS — F17210 Nicotine dependence, cigarettes, uncomplicated: Secondary | ICD-10-CM | POA: Insufficient documentation

## 2020-09-24 DIAGNOSIS — I509 Heart failure, unspecified: Secondary | ICD-10-CM | POA: Insufficient documentation

## 2020-09-24 DIAGNOSIS — R200 Anesthesia of skin: Secondary | ICD-10-CM | POA: Insufficient documentation

## 2020-09-24 DIAGNOSIS — Z79899 Other long term (current) drug therapy: Secondary | ICD-10-CM | POA: Insufficient documentation

## 2020-09-24 DIAGNOSIS — I110 Hypertensive heart disease with heart failure: Secondary | ICD-10-CM | POA: Insufficient documentation

## 2020-09-24 LAB — CBC
HCT: 44.5 % (ref 39.0–52.0)
Hemoglobin: 13.9 g/dL (ref 13.0–17.0)
MCH: 29.4 pg (ref 26.0–34.0)
MCHC: 31.2 g/dL (ref 30.0–36.0)
MCV: 94.1 fL (ref 80.0–100.0)
Platelets: 255 10*3/uL (ref 150–400)
RBC: 4.73 MIL/uL (ref 4.22–5.81)
RDW: 13.3 % (ref 11.5–15.5)
WBC: 12.2 10*3/uL — ABNORMAL HIGH (ref 4.0–10.5)
nRBC: 0 % (ref 0.0–0.2)

## 2020-09-24 LAB — COMPREHENSIVE METABOLIC PANEL
ALT: 19 U/L (ref 0–44)
AST: 19 U/L (ref 15–41)
Albumin: 3.6 g/dL (ref 3.5–5.0)
Alkaline Phosphatase: 80 U/L (ref 38–126)
Anion gap: 8 (ref 5–15)
BUN: 14 mg/dL (ref 6–20)
CO2: 27 mmol/L (ref 22–32)
Calcium: 8.8 mg/dL — ABNORMAL LOW (ref 8.9–10.3)
Chloride: 102 mmol/L (ref 98–111)
Creatinine, Ser: 1.05 mg/dL (ref 0.61–1.24)
GFR, Estimated: >60 mL/min (ref >60)
Glucose, Bld: 97 mg/dL (ref 70–99)
Potassium: 4 mmol/L (ref 3.5–5.1)
Sodium: 137 mmol/L (ref 135–145)
Total Bilirubin: 0.7 mg/dL (ref 0.3–1.2)
Total Protein: 8 g/dL (ref 6.5–8.1)

## 2020-09-24 LAB — DIFFERENTIAL
Abs Immature Granulocytes: 0.05 10*3/uL (ref 0.00–0.07)
Basophils Absolute: 0.1 10*3/uL (ref 0.0–0.1)
Basophils Relative: 1 %
Eosinophils Absolute: 0.4 10*3/uL (ref 0.0–0.5)
Eosinophils Relative: 3 %
Immature Granulocytes: 0 %
Lymphocytes Relative: 17 %
Lymphs Abs: 2 10*3/uL (ref 0.7–4.0)
Monocytes Absolute: 0.7 10*3/uL (ref 0.1–1.0)
Monocytes Relative: 6 %
Neutro Abs: 9 10*3/uL — ABNORMAL HIGH (ref 1.7–7.7)
Neutrophils Relative %: 73 %

## 2020-09-24 LAB — CBG MONITORING, ED: 11800305105: 97

## 2020-09-24 LAB — PROTIME-INR
INR: 0.9 (ref 0.8–1.2)
Prothrombin Time: 12.5 seconds (ref 11.4–15.2)

## 2020-09-24 LAB — APTT: aPTT: 28 seconds (ref 24–36)

## 2020-09-24 MED ORDER — ALBUTEROL SULFATE HFA 108 (90 BASE) MCG/ACT IN AERS: 2.0000 | INHALATION_SPRAY | RESPIRATORY_TRACT | 0 refills | Status: DC | PRN

## 2020-09-24 MED ORDER — SODIUM CHLORIDE 0.9% FLUSH: 3.0000 mL | Freq: Once | INTRAVENOUS | Status: DC

## 2020-09-24 MED ORDER — AZITHROMYCIN 250 MG PO TABS: ORAL_TABLET | ORAL | 0 refills | Status: DC

## 2020-09-24 MED ORDER — PREDNISONE 20 MG PO TABS: 40.0000 mg | ORAL_TABLET | Freq: Every day | ORAL | 0 refills | Status: AC

## 2020-09-24 NOTE — ED Triage Notes (Signed)
Pt c/o dry wheezy cough for the past 3 weeks . Pt states he woke up this morning with numbness to his lower lip, no facial droop, denies any other numbness or weakness,. Pt is ambulatory to triage with a steady gait. Speech is clear

## 2020-09-24 NOTE — ED Provider Notes (Signed)
[facility] Emergency Department Provider Note  ____________________________________________  Time seen: Approximately 3:09 PM  I have reviewed the triage vital signs and the nursing notes.   HISTORY  Chief Complaint Cough and Numbness    HPI Michael Ross is a 50 y.o. male with a history of hypertension and CHF who comes ED complaining of wheezing and cough for the past 3 weeks which started after his grandchild who is 42 years old in daycare had been sick causing him to get sick as well.  Denies chest pain.  No orthopnea or PND.  He is on Lasix which she has been compliant with, denies any worsening swelling currently.  No significant exertional symptoms.  Cough is nonproductive.  No fever or chills.  Reports having acute bronchitis in the past which resolved with prednisone and azithromycin.   Also complains of lower lip numbness this morning with waking up, now resolved.  No other neurologic symptoms.   Past Medical History:  Diagnosis Date  . CHF (congestive heart failure) ([place])   . Hypertension      There are no problems to display for this patient.    Past Surgical History:  Procedure Laterality Date  . CARDIAC CATHETERIZATION       Prior to Admission medications   Medication Sig Start Date End Date Taking? Authorizing Provider  albuterol (PROVENTIL HFA) 108 (90 Base) MCG/ACT inhaler Inhale 2 puffs into the lungs every 4 (four) hours as needed for wheezing or shortness of breath. 09/24/20  Yes Carrie Mew, MD  azithromycin (ZITHROMAX Z-PAK) 250 MG tablet Take 2 tablets (500 mg) on  Day 1,  followed by 1 tablet (250 mg) once daily on Days 2 through 5. 09/24/20  Yes Carrie Mew, MD  predniSONE (DELTASONE) 20 MG tablet Take 2 tablets (40 mg total) by mouth daily with breakfast for 4 days. 09/24/20 09/28/20 Yes Carrie Mew, MD  albuterol (VENTOLIN HFA) 108 (90 Base) MCG/ACT inhaler Inhale 2 puffs into the lungs every 4 (four) hours  as needed for wheezing or shortness of breath. 06/02/19   Cuthriell, Charline Bills, PA-C  atorvastatin (LIPITOR) 10 MG tablet Take 10 mg by mouth daily. 04/15/16 05/15/16  [provider]  benzonatate (TESSALON PERLES) 100 MG capsule Take 1 capsule (100 mg total) by mouth 3 (three) times daily as needed for cough. 07/14/20 07/14/21  Vanessa [place], MD  chlorpheniramine-HYDROcodone (TUSSIONEX PENNKINETIC ER) 10-8 MG/5ML SUER Take 5 mLs by mouth every 12 (twelve) hours as needed for cough. 06/21/19   Fisher, Linden Dolin, PA-C  furosemide (LASIX) 40 MG tablet Take 40 mg by mouth daily. 04/16/16 05/16/16  [provider]     Allergies Lisinopril   No family history on file.  Social History Social History   Tobacco Use  . Smoking status: Current Every Day Smoker    Packs/day: 0.50    Types: Cigarettes    Last attempt to quit: 11/09/2015    Years since quitting: 4.8  . Smokeless tobacco: Never Used  Substance Use Topics  . Alcohol use: Yes    Comment: 1-2 beers twice a week  . Drug use: No    Review of Systems  Constitutional:   No fever or chills.  ENT:   No sore throat. No rhinorrhea. Cardiovascular:   No chest pain or syncope. Respiratory:   No dyspnea, positive cough. Gastrointestinal:   Negative for abdominal pain, vomiting and diarrhea.  Musculoskeletal:   Negative for focal pain or swelling All other systems  reviewed and are negative except as documented above in ROS and HPI.  ____________________________________________   PHYSICAL EXAM:  VITAL SIGNS: ED Triage Vitals  Enc Vitals Group     BP 09/24/20 1335 117/74     Pulse Rate 09/24/20 1335 65     Resp 09/24/20 1335 18     Temp 09/24/20 1339 98.6 F (37 C)     Temp Source 09/24/20 1335 Oral     SpO2 09/24/20 1335 94 %     Weight 09/24/20 1335 250 lb (113.4 kg)     Height 09/24/20 1335 5\' 5"  (1.651 m)     Head Circumference --      Peak Flow --      Pain Score 09/24/20 1335 0     Pain Loc --      Pain Edu?  --      Excl. in [place]? --     Vital signs reviewed, nursing assessments reviewed.   Constitutional:   Alert and oriented. Non-toxic appearance. Eyes:   Conjunctivae are normal. EOMI. PERRL.  No nystagmus ENT      Head:   Normocephalic and atraumatic.      Nose:   Normal      Mouth/Throat:   Normal, moist mucosa.      Neck:   No meningismus. Full ROM. Hematological/Lymphatic/Immunilogical:   No cervical lymphadenopathy. Cardiovascular:   RRR. Symmetric bilateral radial and DP pulses.  No murmurs. Cap refill less than 2 seconds. Respiratory:   Normal respiratory effort without tachypnea/retractions.  No crackles.  There is diffuse expiratory wheezing and prolonged expiratory phase wheezes do accentuated by FEV1 maneuver.  Symmetric air movement. Gastrointestinal:   Soft and nontender. Non distended. There is no CVA tenderness.  No rebound, rigidity, or guarding. Genitourinary:   deferred Musculoskeletal:   Normal range of motion in all extremities. No joint effusions.  No lower extremity tenderness.  No edema. Neurologic:   Normal speech and language.  Motor grossly intact. No acute focal neurologic deficits are appreciated.  Skin:    Skin is warm, dry and intact. No rash noted.  No petechiae, purpura, or bullae.  ____________________________________________    LABS (pertinent positives/negatives) (all labs ordered are listed, but only abnormal results are displayed) Labs Reviewed  CBC - Abnormal; Notable for the following components:      Result Value   WBC 12.2 (*)    All other components within normal limits  DIFFERENTIAL - Abnormal; Notable for the following components:   Neutro Abs 9.0 (*)    All other components within normal limits  COMPREHENSIVE METABOLIC PANEL - Abnormal; Notable for the following components:   Calcium 8.8 (*)    All other components within normal limits  PROTIME-INR  APTT  CBG MONITORING, ED    ____________________________________________   EKG Interpreted by me Normal sinus rhythm rate of 72, normal axis and intervals.  Poor R wave progression.  Normal ST segments.  Isolated T wave inversion in lead III which is nonspecific.  No evidence of right heart strain.   ____________________________________________    RADIOLOGY  DG Chest 2 View  Result Date: 09/24/2020 CLINICAL DATA:  Cough and shortness of breath. EXAM: CHEST - 2 VIEW COMPARISON:  Chest x-ray dated July 14, 2020. FINDINGS: The heart size and mediastinal contours are within normal limits. Normal pulmonary vascularity. Previously seen ill-defined nodular density at the left lung base is no longer identified. Minimal linear scarring in the lingula. No focal consolidation, pleural effusion, or pneumothorax.  No acute osseous abnormality. IMPRESSION: 1. No acute cardiopulmonary disease. Electronically Signed   By: Titus Dubin M.D.   On: 09/24/2020 14:46   CT HEAD WO CONTRAST  Result Date: 09/24/2020 CLINICAL DATA:  Onset lower lip numbness this morning. EXAM: CT HEAD WITHOUT CONTRAST TECHNIQUE: Contiguous axial images were obtained from the base of the skull through the vertex without intravenous contrast. COMPARISON:  None. FINDINGS: Brain: No evidence of acute infarction, hemorrhage, hydrocephalus, extra-axial collection or mass lesion/mass effect. Vascular: No hyperdense vessel or unexpected calcification. Skull: Intact.  No focal lesion. Sinuses/Orbits: Negative. 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 IMPRESSION: No acute intracranial abnormality. 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 Electronically Signed   By: Inge Rise M.D.   On: 09/24/2020 14:20    ____________________________________________   PROCEDURES Procedures  ____________________________________________  DIFFERENTIAL DIAGNOSIS   Pneumonia, pleural effusion, pulmonary  edema, acute bronchitis, viral illness  CLINICAL IMPRESSION / ASSESSMENT AND PLAN / ED COURSE  Medications ordered in the ED: Medications  sodium chloride flush (NS) 0.9 % injection 3 mL (3 mLs Intravenous Not Given 09/24/20 1425)    Pertinent labs & imaging results that were available during my care of the patient were reviewed by me and considered in my medical decision making (see chart for details).  Claudius Heyer was evaluated in Emergency Department on 09/24/2020 for the symptoms described in the history of present illness. He was evaluated in the context of the global COVID-19 pandemic, which necessitated consideration that the patient might be at risk for infection with the SARS-CoV-2 virus that causes COVID-19. Institutional protocols and algorithms that pertain to the evaluation of patients at risk for COVID-19 are in a state of rapid change based on information released by regulatory bodies including the CDC and federal and state organizations. These policies and algorithms were followed during the patient's care in the ED.   Patient presents with cough and wheezing for the past 3 weeks after likely viral illness, possibly COVID or flu.  After this period of time, would not be a candidate for any treatment and he has overall improved other than bronchitis symptoms.    Clinical Course as of 09/24/20 1514  Thu Sep 24, 2020  1442 Chest x-ray viewed and interpreted by me, appears unremarkable without pleural effusion or pulmonary edema, no pneumothorax or airspace disease. [PS]    Clinical Course User Index [PS] Carrie Mew, MD   Labs unremarkable. Considering the patient's symptoms, medical history, and physical examination today, I have low suspicion for ACS, PE, TAD, pneumothorax, carditis, mediastinitis, pneumonia, CHF, or sepsis.  Will plan for outpatient management with prednisone, azithromycin, albuterol.  ____________________________________________   FINAL CLINICAL  IMPRESSION(S) / ED DIAGNOSES    Final diagnoses:  Acute bronchitis, unspecified organism     ED Discharge Orders         Ordered    albuterol (PROVENTIL HFA) 108 (90 Base) MCG/ACT inhaler  Every 4 hours PRN        09/24/20 1509    predniSONE (DELTASONE) 20 MG tablet  Daily with breakfast        09/24/20 1509    azithromycin (ZITHROMAX Z-PAK) 250 MG tablet        09/24/20 1509          Portions of this note were generated with dragon dictation software. Dictation errors may occur despite best attempts at proofreading.  Carrie Mew, MD 09/24/20 [phone]

## 2020-09-24 NOTE — ED Notes (Signed)
Provided DC instructions. Verbalized understanding.  

## 2021-03-27 ENCOUNTER — Encounter: Payer: Self-pay | Admitting: Emergency Medicine

## 2021-03-27 ENCOUNTER — Emergency Department: Payer: Self-pay

## 2021-03-27 ENCOUNTER — Emergency Department
Admission: EM | Admit: 2021-03-27 | Discharge: 2021-03-27 | Disposition: A | Discharge status: Home / Self Care | Payer: Self-pay | Attending: Emergency Medicine | Admitting: Emergency Medicine

## 2021-03-27 DIAGNOSIS — I509 Heart failure, unspecified: Secondary | ICD-10-CM | POA: Insufficient documentation

## 2021-03-27 DIAGNOSIS — G8929 Other chronic pain: Secondary | ICD-10-CM | POA: Insufficient documentation

## 2021-03-27 DIAGNOSIS — F17210 Nicotine dependence, cigarettes, uncomplicated: Secondary | ICD-10-CM | POA: Insufficient documentation

## 2021-03-27 DIAGNOSIS — N492 Inflammatory disorders of scrotum: Secondary | ICD-10-CM | POA: Insufficient documentation

## 2021-03-27 DIAGNOSIS — I110 Hypertensive heart disease with heart failure: Secondary | ICD-10-CM | POA: Insufficient documentation

## 2021-03-27 DIAGNOSIS — Z79899 Other long term (current) drug therapy: Secondary | ICD-10-CM | POA: Insufficient documentation

## 2021-03-27 DIAGNOSIS — N5082 Scrotal pain: Secondary | ICD-10-CM

## 2021-03-27 LAB — CHLAMYDIA/NGC RT PCR (ARMC ONLY)
Chlamydia Tr: NOT DETECTED
N gonorrhoeae: NOT DETECTED

## 2021-03-27 LAB — URINALYSIS, ROUTINE W REFLEX MICROSCOPIC
Bacteria, UA: NONE SEEN
Bilirubin Urine: NEGATIVE
Glucose, UA: NEGATIVE mg/dL
Hgb urine dipstick: NEGATIVE
Ketones, ur: NEGATIVE mg/dL
Leukocytes,Ua: NEGATIVE
Nitrite: NEGATIVE
Protein, ur: 30 mg/dL — AB
Specific Gravity, Urine: 1.021 (ref 1.005–1.030)
pH: 5 (ref 5.0–8.0)

## 2021-03-27 MED ORDER — SULFAMETHOXAZOLE-TRIMETHOPRIM 800-160 MG PO TABS: 1.0000 | ORAL_TABLET | Freq: Two times a day (BID) | ORAL | 0 refills | Status: AC

## 2021-03-27 NOTE — ED Provider Notes (Signed)
[facility] Emergency Department Provider Note ____________________________________________   Event Date/Time   First MD Initiated Contact with Patient 03/27/21 1500     (approximate)  I have reviewed the triage vital signs and the nursing notes.  HISTORY  Chief Complaint Groin Pain   HPI Michael Ross is a 50 y.o. malewho presents to the ED for evaluation of acute on chronic groin pain.  Chart review indicates morbidly obese patient with hypertension and CHF.  Patient presents to the ED for evaluation of acute on chronic left-sided scrotal pain.  He reports a few months of intermittent and mild pain to this area without any preceding trauma or events.  He reports this pain worsening over the past couple days, socially he did not sleep well last night, so he presents to the ED for evaluation.  Patient reports no fever, no trauma, no dysuria, hematuria or penile discharge.  No recent antibiotics.  No history of nephrolithiasis.  No flank pain or inguinal pain.  No lesions or known STDs.  No recent antibiotics.  Past Medical History:  Diagnosis Date   CHF (congestive heart failure) ([place])    Hypertension     There are no problems to display for this patient.   Past Surgical History:  Procedure Laterality Date   CARDIAC CATHETERIZATION      Prior to Admission medications   Medication Sig Start Date End Date Taking? Authorizing Provider  sulfamethoxazole-trimethoprim (BACTRIM DS) 800-160 MG tablet Take 1 tablet by mouth 2 (two) times daily for 5 days. 03/27/21 04/01/21 Yes Vladimir Crofts, MD  albuterol (PROVENTIL HFA) 108 (90 Base) MCG/ACT inhaler Inhale 2 puffs into the lungs every 4 (four) hours as needed for wheezing or shortness of breath. 09/24/20   Carrie Mew, MD  albuterol (VENTOLIN HFA) 108 (90 Base) MCG/ACT inhaler Inhale 2 puffs into the lungs every 4 (four) hours as needed for wheezing or shortness of breath. 06/02/19   Cuthriell,  Charline Bills, PA-C  atorvastatin (LIPITOR) 10 MG tablet Take 10 mg by mouth daily. 04/15/16 05/15/16  [provider]  azithromycin (ZITHROMAX Z-PAK) 250 MG tablet Take 2 tablets (500 mg) on  Day 1,  followed by 1 tablet (250 mg) once daily on Days 2 through 5. 09/24/20   Carrie Mew, MD  benzonatate (TESSALON PERLES) 100 MG capsule Take 1 capsule (100 mg total) by mouth 3 (three) times daily as needed for cough. 07/14/20 07/14/21  Vanessa Medora, MD  chlorpheniramine-HYDROcodone (TUSSIONEX PENNKINETIC ER) 10-8 MG/5ML SUER Take 5 mLs by mouth every 12 (twelve) hours as needed for cough. 06/21/19   Fisher, Linden Dolin, PA-C  furosemide (LASIX) 40 MG tablet Take 40 mg by mouth daily. 04/16/16 05/16/16  [provider]    Allergies Lisinopril  No family history on file.  Social History Social History   Tobacco Use   Smoking status: Every Day    Packs/day: 0.50    Types: Cigarettes    Last attempt to quit: 11/09/2015    Years since quitting: 5.3   Smokeless tobacco: Never  Substance Use Topics   Alcohol use: Yes    Comment: 1-2 beers twice a week   Drug use: No    Review of Systems  Constitutional: No fever/chills Eyes: No visual changes. ENT: No sore throat. Cardiovascular: Denies chest pain. Respiratory: Denies shortness of breath. Gastrointestinal: No abdominal pain.  No nausea, no vomiting.  No diarrhea.  No constipation. Genitourinary: Negative for dysuria. Positive for atraumatic left scrotal pain  Musculoskeletal: Negative for back pain. Skin: Negative for rash. Neurological: Negative for headaches, focal weakness or numbness.  ____________________________________________   PHYSICAL EXAM:  VITAL SIGNS: Vitals:   03/27/21 1157  BP: (!) 140/92  Pulse: 75  Resp: 18  Temp: 99 F (37.2 C)  SpO2: 95%     Constitutional: Alert and oriented. Well appearing and in no acute distress.  Morbidly obese.  Ambulatory, pleasant and conversational. Eyes: Conjunctivae  are normal. PERRL. EOMI. Head: Atraumatic. Nose: No congestion/rhinnorhea. Mouth/Throat: Mucous membranes are moist.  Oropharynx non-erythematous. Neck: No stridor. No cervical spine tenderness to palpation. Cardiovascular: Normal rate, regular rhythm. Grossly normal heart sounds.  Good peripheral circulation. Respiratory: Normal respiratory effort.  No retractions. Lungs CTAB. Gastrointestinal: Soft , nondistended, nontender to palpation. No CVA tenderness.  Benign abdomen. GU: Normal-appearing external genitalia without skin lesions to inguinal canals, inguinal tenderness or fullness. Fullness of the skin and slightly firm/induration skin to left side of the scrotum.  No fluctuance to suggest associated abscess. Bilateral testicles are palpated, smooth, round and without nodularity. Musculoskeletal: No lower extremity tenderness nor edema.  No joint effusions. No signs of acute trauma. Neurologic:  Normal speech and language. No gross focal neurologic deficits are appreciated. No gait instability noted. Skin:  Skin is warm, dry and intact. No rash noted. Psychiatric: Mood and affect are normal. Speech and behavior are normal. ____________________________________________   LABS (all labs ordered are listed, but only abnormal results are displayed)  Labs Reviewed  URINALYSIS, ROUTINE W REFLEX MICROSCOPIC - Abnormal; Notable for the following components:      Result Value   Color, Urine YELLOW (*)    APPearance CLEAR (*)    Protein, ur 30 (*)    All other components within normal limits  CHLAMYDIA/NGC RT PCR ([facility] ONLY)             ____________________________________________  12 Lead EKG   ____________________________________________  RADIOLOGY  ED MD interpretation:    Official radiology report(s): US SCROTUM W/DOPPLER  Result Date: 03/27/2021 CLINICAL DATA:  Scrotal pain EXAM: SCROTAL ULTRASOUND DOPPLER ULTRASOUND OF THE TESTICLES TECHNIQUE: Complete ultrasound  examination of the testicles, epididymis, and other scrotal structures was performed. Color and spectral Doppler ultrasound were also utilized to evaluate blood flow to the testicles. COMPARISON:  None. FINDINGS: Right testicle Measurements: 3.3 x 1.7 x 2 cm. No mass or microlithiasis visualized. Left testicle Measurements: 3 x 2.1 x 2.2 cm. No mass or microlithiasis visualized. Right epididymis:  There is a small 0.9 cm epididymal cyst. Left epididymis:  There is a 1.8 cm epididymal cyst. Hydrocele:  None visualized. Varicocele:  None visualized. Pulsed Doppler interrogation of both testes demonstrates normal low resistance arterial and venous waveforms bilaterally. There is scrotal wall thickening and swelling of the left scrotal region. IMPRESSION: No evidence of testicular torsion. Wall thickening with swelling of the left scrotal region. Bilateral epididymal cysts. Electronically Signed   By: Abelardo Diesel M.D.   On: 03/27/2021 13:38    ____________________________________________   PROCEDURES and INTERVENTIONS  Procedure(s) performed (including Critical Care):  Procedures  Medications - No data to display  ____________________________________________   MDM / ED COURSE   50 year old male presents to the ED with evidence of uncomplicated left-sided scrotal cellulitis amenable to outpatient management.  No evidence of sepsis or systemic illness.  No evidence of testicular torsion, trauma.  Ureterolithiasis is less likely.  No evidence of abscess or need for I&D.  We will get him started on Bactrim and provide  information to follow-up with urology.  Return precautions discussed.     ____________________________________________   FINAL CLINICAL IMPRESSION(S) / ED DIAGNOSES  Final diagnoses:  Scrotal pain  Cellulitis, scrotum     ED Discharge Orders          Ordered    sulfamethoxazole-trimethoprim (BACTRIM DS) 800-160 MG tablet  2 times daily        03/27/21 1535              Weiland Tomich Tamala Julian   Note:  This document was prepared using Systems analyst and may include unintentional dictation errors.    Vladimir Crofts, MD 03/27/21 [phone]

## 2021-03-27 NOTE — ED Triage Notes (Signed)
Pt endorses groin pain for awhile that has worsened over the past 2 days. Denies any swelling or discharge.

## 2021-03-27 NOTE — Discharge Instructions (Signed)
Please take Tylenol and ibuprofen/Advil for your pain.  It is safe to take them together, or to alternate them every few hours.  Take up to 1000mg  of Tylenol at a time, up to 4 times per day.  Do not take more than 4000 mg of Tylenol in 24 hours.  For ibuprofen, take 400-600 mg, 4-5 times per day.  Take the Bactrim antibiotic twice daily for the next 5 days to treat skin infection of the left side of your scrotum.   If your pain does not improve after finishing his antibiotic course, please reach out to the urologist then.  I have attached the phone number.

## 2021-03-27 NOTE — ED Provider Notes (Signed)
Emergency Medicine Provider Triage Evaluation Note  Michael Ross , a 50 y.o. male  was evaluated in triage.  Pt complains of left testicle pain.  Patient presents emergency department with 2 days of testicular pain with a "knot" on the left scrotum.  No dysuria, polyuria, hematuria.  No abdominal or flank pain..  Review of Systems  Positive: Left groin/testicle pain Negative: Dysuria, polyuria, hematuria, abdominal pain, flank pain  Physical Exam  BP (!) 140/92   Pulse 75   Temp 99 F (37.2 C) (Oral)   Resp 18   Ht 5\' 5"  (1.651 m)   Wt 115.7 kg   SpO2 95%   BMI 42.43 kg/m  Gen:   Awake, no distress   Resp:  Normal effort  MSK:   Moves extremities without difficulty  Other:  Bowel sounds x4 quadrants, nontender over the abdomen and no CVA tenderness  Medical Decision Making  Medically screening exam initiated at 12:02 PM.  Appropriate orders placed.  Decklyn Alphin was informed that the remainder of the evaluation will be completed by another provider, this initial triage assessment does not replace that evaluation, and the importance of remaining in the ED until their evaluation is complete.  Patient presents with left testicle pain with a "knot" in his left scrotum.  Patient will have urinalysis, gonorrhea chlamydia and ultrasound   Darletta Moll, PA-C 03/27/21 1203    Vladimir Crofts, MD 03/27/21 [phone]

## 2021-04-22 ENCOUNTER — Other Ambulatory Visit: Payer: Self-pay

## 2021-04-22 ENCOUNTER — Encounter: Payer: Self-pay | Admitting: Emergency Medicine

## 2021-04-22 ENCOUNTER — Emergency Department
Admission: EM | Admit: 2021-04-22 | Discharge: 2021-04-22 | Disposition: A | Discharge status: Home / Self Care | Payer: Self-pay | Attending: Student in an Organized Health Care Education/Training Program | Admitting: Student in an Organized Health Care Education/Training Program

## 2021-04-22 DIAGNOSIS — I110 Hypertensive heart disease with heart failure: Secondary | ICD-10-CM | POA: Insufficient documentation

## 2021-04-22 DIAGNOSIS — S0502XA Injury of conjunctiva and corneal abrasion without foreign body, left eye, initial encounter: Secondary | ICD-10-CM | POA: Insufficient documentation

## 2021-04-22 DIAGNOSIS — Z79899 Other long term (current) drug therapy: Secondary | ICD-10-CM | POA: Insufficient documentation

## 2021-04-22 DIAGNOSIS — F17210 Nicotine dependence, cigarettes, uncomplicated: Secondary | ICD-10-CM | POA: Insufficient documentation

## 2021-04-22 DIAGNOSIS — X58XXXA Exposure to other specified factors, initial encounter: Secondary | ICD-10-CM | POA: Insufficient documentation

## 2021-04-22 DIAGNOSIS — I509 Heart failure, unspecified: Secondary | ICD-10-CM | POA: Insufficient documentation

## 2021-04-22 MED ORDER — FLUORESCEIN SODIUM 1 MG OP STRP
1.0000 | ORAL_STRIP | Freq: Once | OPHTHALMIC | Status: AC
  Administered 2021-04-22: 1 via OPHTHALMIC
  Filled 2021-04-22: qty 1

## 2021-04-22 MED ORDER — POLYMYXIN B-TRIMETHOPRIM 10000-0.1 UNIT/ML-% OP SOLN: 2.0000 [drp] | Freq: Four times a day (QID) | OPHTHALMIC | 0 refills | Status: DC

## 2021-04-22 MED ORDER — KETOROLAC TROMETHAMINE 0.5 % OP SOLN: 1.0000 [drp] | Freq: Four times a day (QID) | OPHTHALMIC | 0 refills | Status: DC

## 2021-04-22 MED ORDER — TETRACAINE HCL 0.5 % OP SOLN
2.0000 [drp] | Freq: Once | OPHTHALMIC | Status: AC
  Administered 2021-04-22: 2 [drp] via OPHTHALMIC
  Filled 2021-04-22: qty 4

## 2021-04-22 NOTE — ED Notes (Signed)
Presents to the ED with L eye pain and irritation. States that he woke up this morning noticing his sclera of L eye to be reddened. Clear drainage noted. 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 Blurred vision L eye.

## 2021-04-22 NOTE — ED Provider Notes (Signed)
[facility] Emergency Department Provider Note  ____________________________________________  Time seen: Approximately 5:17 PM  I have reviewed the triage vital signs and the nursing notes.   HISTORY  Chief Complaint Eye Pain    HPI Michael Ross is a 50 y.o. male who presents emergency department complaining of left eye pain.  Patient has redness, increased tearing of the left eye.  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  He states that he sweats heavily at the job and he may have rubbed his face/eyes with insulation material on his hands.  No purulent drainage from his eyes.  No vision changes.  Does not wear glasses or contacts.  No other complaints at this time.       Past Medical History:  Diagnosis Date   CHF (congestive heart failure) ([place])    Hypertension     There are no problems to display for this patient.   Past Surgical History:  Procedure Laterality Date   CARDIAC CATHETERIZATION      Prior to Admission medications   Medication Sig Start Date End Date Taking? Authorizing Provider  ketorolac (ACULAR) 0.5 % ophthalmic solution Place 1 drop into the right eye 4 (four) times daily. 04/22/21  Yes Lathen Seal, Charline Bills, PA-C  trimethoprim-polymyxin b (POLYTRIM) ophthalmic solution Place 2 drops into the left eye every 6 (six) hours. 04/22/21  Yes Annarae Macnair, Charline Bills, PA-C  albuterol (PROVENTIL HFA) 108 (90 Base) MCG/ACT inhaler Inhale 2 puffs into the lungs every 4 (four) hours as needed for wheezing or shortness of breath. 09/24/20   Carrie Mew, MD  albuterol (VENTOLIN HFA) 108 (90 Base) MCG/ACT inhaler Inhale 2 puffs into the lungs every 4 (four) hours as needed for wheezing or shortness of breath. 06/02/19   Mylene Bow, Charline Bills, PA-C  atorvastatin (LIPITOR) 10 MG tablet Take 10 mg by mouth daily. 04/15/16 05/15/16   [provider]  azithromycin (ZITHROMAX Z-PAK) 250 MG tablet Take 2 tablets (500 mg) on  Day 1,  followed by 1 tablet (250 mg) once daily on Days 2 through 5. 09/24/20   Carrie Mew, MD  benzonatate (TESSALON PERLES) 100 MG capsule Take 1 capsule (100 mg total) by mouth 3 (three) times daily as needed for cough. 07/14/20 07/14/21  Vanessa Worthing, MD  chlorpheniramine-HYDROcodone (TUSSIONEX PENNKINETIC ER) 10-8 MG/5ML SUER Take 5 mLs by mouth every 12 (twelve) hours as needed for cough. 06/21/19   Fisher, Linden Dolin, PA-C  furosemide (LASIX) 40 MG tablet Take 40 mg by mouth daily. 04/16/16 05/16/16  [provider]    Allergies Lisinopril  History reviewed. No pertinent family history.  Social History Social History   Tobacco Use   Smoking status: Every Day    Packs/day: 0.50    Types: Cigarettes    Last attempt to quit: 11/09/2015    Years since quitting: 5.4   Smokeless tobacco: Never  Substance Use Topics   Alcohol use: Yes    Comment: 1-2 beers twice a week   Drug use: No     Review of Systems  Constitutional: No fever/chills Eyes: No visual changes. No discharge.  Foreign body sensation to the left eye with increased tearing ENT: No upper respiratory complaints. Cardiovascular: no chest pain. Respiratory: no cough. No SOB. Gastrointestinal: No abdominal pain.  No nausea, no vomiting.  No diarrhea.  No constipation. Musculoskeletal: Negative  for musculoskeletal pain. Skin: Negative for rash, abrasions, lacerations, ecchymosis. Neurological: Negative for headaches, focal weakness or numbness.  10 System ROS otherwise negative.  ____________________________________________   PHYSICAL EXAM:  VITAL SIGNS: ED Triage Vitals [04/22/21 1530]  Enc Vitals Group     BP 137/78     Pulse Rate 90     Resp 20     Temp 98.6 F (37 C)     Temp Source Oral     SpO2 95 %     Weight 255 lb (115.7 kg)     Height 5\' 5"  (1.651 m)     Head Circumference      Peak Flow       Pain Score 8     Pain Loc      Pain Edu?      Excl. in [place]?      Constitutional: Alert and oriented. Well appearing and in no acute distress. Eyes: Conjunctivae are normal. PERRL. EOMI. funduscopic exam of bilateral eyes reveals no acute findings, red reflex present bilaterally, vasculature and optic disc is unremarkable.  Left eye is anesthetized using tetracaine drops.  Fluorescein staining applied with area of uptake over the iris consistent with a corneal abrasion.  No retained foreign body identified. Head: Atraumatic. ENT:      Ears:       Nose: No congestion/rhinnorhea.      Mouth/Throat: Mucous membranes are moist.  Neck: No stridor.    Cardiovascular: Normal rate, regular rhythm. Normal S1 and S2.  Good peripheral circulation. Respiratory: Normal respiratory effort without tachypnea or retractions. Lungs CTAB. Good air entry to the bases with no decreased or absent breath sounds. Musculoskeletal: Full range of motion to all extremities. No gross deformities appreciated. Neurologic:  Normal speech and language. No gross focal neurologic deficits are appreciated.  Skin:  Skin is warm, dry and intact. No rash noted. Psychiatric: Mood and affect are normal. Speech and behavior are normal. Patient exhibits appropriate insight and judgement.   ____________________________________________   LABS (all labs ordered are listed, but only abnormal results are displayed)  Labs Reviewed - No data to display ____________________________________________  EKG   ____________________________________________  RADIOLOGY   No results found.  ____________________________________________    PROCEDURES  Procedure(s) performed:    Procedures    Medications  fluorescein ophthalmic strip 1 strip (has no administration in time range)  tetracaine (PONTOCAINE) 0.5 % ophthalmic solution 2 drop (has no administration in time range)      ____________________________________________   INITIAL IMPRESSION / ASSESSMENT AND PLAN / ED COURSE  Pertinent labs & imaging results that were available during my care of the patient were reviewed by me and considered in my medical decision making (see chart for details).  Review of the [place] CSRS was performed in accordance of the [place] prior to dispensing any controlled drugs.           Patient's diagnosis is consistent with corneal abrasion.  Patient presents the emergency department the foreign body sensation to the left eye.  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  Patient states that he may have rubbed his face with his hands and make contact with his eyes.  On physical exam, patient does have a corneal abrasion.  No retained foreign body.  Patiently placed on antibiotic eyedrops and Acular for symptom relief.  Follow-up with primary care or ophthalmology as needed..  Patient is given ED precautions to return to the ED for any worsening or  new symptoms.     ____________________________________________  FINAL CLINICAL IMPRESSION(S) / ED DIAGNOSES  Final diagnoses:  Abrasion of left cornea, initial encounter      NEW MEDICATIONS STARTED DURING THIS VISIT:  ED Discharge Orders          Ordered    trimethoprim-polymyxin b (POLYTRIM) ophthalmic solution  Every 6 hours        04/22/21 1721    ketorolac (ACULAR) 0.5 % ophthalmic solution  4 times daily        04/22/21 1721                This chart was dictated using voice recognition software/Dragon. Despite best efforts to proofread, errors can occur which can change the meaning. Any change was purely unintentional.    Darletta Moll, PA-C 04/22/21 1721    Merlyn Lot, MD 04/22/21 2201

## 2021-04-22 NOTE — ED Notes (Signed)
Patient declined discharge vital signs. 

## 2021-04-22 NOTE — ED Triage Notes (Signed)
Pt comes into the ED via POV c/o left eye swelling and irriation.  Pt states he feels as though something is in the eye.  Pt presents with redness and watering of the eye.

## 2021-05-18 ENCOUNTER — Emergency Department: Payer: Self-pay

## 2021-05-18 ENCOUNTER — Other Ambulatory Visit: Payer: Self-pay

## 2021-05-18 ENCOUNTER — Emergency Department
Admission: EM | Admit: 2021-05-18 | Discharge: 2021-05-18 | Disposition: A | Discharge status: Home / Self Care | Payer: Self-pay | Attending: Emergency Medicine | Admitting: Emergency Medicine

## 2021-05-18 DIAGNOSIS — R0789 Other chest pain: Secondary | ICD-10-CM

## 2021-05-18 DIAGNOSIS — J40 Bronchitis, not specified as acute or chronic: Secondary | ICD-10-CM | POA: Insufficient documentation

## 2021-05-18 DIAGNOSIS — I509 Heart failure, unspecified: Secondary | ICD-10-CM | POA: Insufficient documentation

## 2021-05-18 DIAGNOSIS — I110 Hypertensive heart disease with heart failure: Secondary | ICD-10-CM | POA: Insufficient documentation

## 2021-05-18 LAB — CBC
HCT: 46 % (ref 39.0–52.0)
Hemoglobin: 14.6 g/dL (ref 13.0–17.0)
MCH: 29 pg (ref 26.0–34.0)
MCHC: 31.7 g/dL (ref 30.0–36.0)
MCV: 91.3 fL (ref 80.0–100.0)
Platelets: 345 10*3/uL (ref 150–400)
RBC: 5.04 MIL/uL (ref 4.22–5.81)
RDW: 13.5 % (ref 11.5–15.5)
WBC: 14 10*3/uL — ABNORMAL HIGH (ref 4.0–10.5)
nRBC: 0 % (ref 0.0–0.2)

## 2021-05-18 LAB — TROPONIN I (HIGH SENSITIVITY): Troponin I (High Sensitivity): 7 ng/L (ref <18)

## 2021-05-18 LAB — BASIC METABOLIC PANEL
Anion gap: 5 (ref 5–15)
BUN: 14 mg/dL (ref 6–20)
CO2: 30 mmol/L (ref 22–32)
Calcium: 8.9 mg/dL (ref 8.9–10.3)
Chloride: 99 mmol/L (ref 98–111)
Creatinine, Ser: 1.16 mg/dL (ref 0.61–1.24)
GFR, Estimated: >60 mL/min (ref >60)
Glucose, Bld: 100 mg/dL — ABNORMAL HIGH (ref 70–99)
Potassium: 3.7 mmol/L (ref 3.5–5.1)
Sodium: 134 mmol/L — ABNORMAL LOW (ref 135–145)

## 2021-05-18 MED ORDER — HYDROCOD POLST-CPM POLST ER 10-8 MG/5ML PO SUER: 5.0000 mL | Freq: Two times a day (BID) | ORAL | 0 refills | Status: DC | PRN

## 2021-05-18 MED ORDER — ALBUTEROL SULFATE HFA 108 (90 BASE) MCG/ACT IN AERS: 2.0000 | INHALATION_SPRAY | Freq: Four times a day (QID) | RESPIRATORY_TRACT | 0 refills | Status: DC | PRN

## 2021-05-18 NOTE — ED Provider Notes (Signed)
[facility] Provider Note    Event Date/Time   First MD Initiated Contact with Patient 05/18/21 1250     (approximate)   History   Cough and Chest Pain   HPI  Michael Ross is a 51 y.o. male with past medical history of hypertension and CHF who presents to the ED complaining of cough and chest pain.  Patient reports that he has been dealing with a productive cough for about the past week.  He has been having sharp pain in the center of his chest over this timeframe, particularly when he coughs.  He describes this pain as constant but denies any significant difficulty breathing associated with it.  He has not had any fevers and denies any nausea, vomiting, or diarrhea.  He has not noticed any pain or swelling in his legs, but states he does not typically have swelling in his legs when he becomes fluid overloaded.  He continues to take Lasix as prescribed.     Physical Exam   Triage Vital Signs: ED Triage Vitals  Enc Vitals Group     BP 05/18/21 1000 (!) 146/74     Pulse Rate 05/18/21 1000 78     Resp 05/18/21 1000 19     Temp 05/18/21 1000 98.5 F (36.9 C)     Temp Source 05/18/21 1000 Oral     SpO2 05/18/21 1000 94 %     Weight 05/18/21 1005 260 lb (117.9 kg)     Height 05/18/21 1005 5\' 5"  (1.651 m)     Head Circumference --      Peak Flow --      Pain Score 05/18/21 1005 8     Pain Loc --      Pain Edu? --      Excl. in [place]? --     Most recent vital signs: Vitals:   05/18/21 1000  BP: (!) 146/74  Pulse: 78  Resp: 19  Temp: 98.5 F (36.9 C)  SpO2: 94%    General: Awake, no distress. CV:  Good peripheral perfusion.  Regular rate and rhythm, no murmurs, rubs, or gallops.  2+ radial pulses bilaterally. Resp:  Normal effort.  Lungs clear to auscultation bilaterally. Abd:  No distention.  Soft and nontender. Other:  No lower extremity edema or tenderness to palpation.   ED Results / Procedures / Treatments   Labs (all labs ordered  are listed, but only abnormal results are displayed) Labs Reviewed  BASIC METABOLIC PANEL - Abnormal; Notable for the following components:      Result Value   Sodium 134 (*)    Glucose, Bld 100 (*)    All other components within normal limits  CBC - Abnormal; Notable for the following components:   WBC 14.0 (*)    All other components within normal limits  RESP PANEL BY RT-PCR (FLU A&B, COVID) ARPGX2  TROPONIN I (HIGH SENSITIVITY)  TROPONIN I (HIGH SENSITIVITY)     EKG  ED ECG REPORT I, Blake Divine, the attending physician, personally viewed and interpreted this ECG.   Date: 05/18/2021  EKG Time: 10:05  Rate: 75  Rhythm: normal sinus rhythm  Axis: Normal  Intervals:none  ST&T Change: None   RADIOLOGY Chest x-ray reviewed by me with no infiltrate, edema, or effusion, negative for acute process per radiology.  PROCEDURES:  Critical Care performed: No  .1-3 Lead EKG Interpretation Performed by: Blake Divine, MD Authorized by: Blake Divine, MD     Interpretation: normal  ECG rate:  65-75   ECG rate assessment: normal     Rhythm: sinus rhythm     Ectopy: none     Conduction: normal     MEDICATIONS ORDERED IN ED: Medications - No data to display   IMPRESSION / MDM / [place] / ED COURSE  I reviewed the triage vital signs and the nursing notes.                              51 year old male with past medical history of hypertension and CHF who presents to the ED complaining of persistent productive cough for the past week associated with a sharp pain in his chest that is worse when he is coughing.  Patient is not in any respiratory distress and is maintaining O2 sats on room air.  Patient's chart reviewed from prior cardiology visits at [facility] and he was found to have nonischemic cardiomyopathy with EF of 40 to 45%, subsequently improved on follow-up echocardiogram.  Differential diagnosis includes, but is not limited to, CHF exacerbation, ACS,  PE, bronchitis, viral syndrome.  Work-up from triage is reassuring, EKG shows no evidence of arrhythmia or ischemia and chest x-ray is unremarkable.  Troponin is negative and I doubt ACS given constant symptoms for 1 week.  Symptoms sound most consistent with bronchitis, patient is PERC negative and I doubt PE.  CBC and BMP are unremarkable, no evidence of anemia or electrolyte abnormality.  Patient is appropriate for outpatient management, counseled to follow-up with cardiology or establish care with PCP.  We will prescribe albuterol Intestinex for symptomatic management, patient counseled to return to the ED for new worsening symptoms.  Patient agrees with plan.  The patient is on the cardiac monitor to evaluate for evidence of arrhythmia and/or significant heart rate changes.      FINAL CLINICAL IMPRESSION(S) / ED DIAGNOSES   Final diagnoses:  Bronchitis  Atypical chest pain     Rx / DC Orders   ED Discharge Orders          Ordered    albuterol (VENTOLIN HFA) 108 (90 Base) MCG/ACT inhaler  Every 6 hours PRN       Note to Pharmacy: Please supply with spacer   05/18/21 1336    chlorpheniramine-HYDROcodone (TUSSIONEX PENNKINETIC ER) 10-8 MG/5ML SUER  Every 12 hours PRN        05/18/21 1336             Note:  This document was prepared using Dragon voice recognition software and may include unintentional dictation errors.   Blake Divine, MD 05/18/21 1341

## 2021-05-18 NOTE — ED Triage Notes (Signed)
Pt states that he has been coughing for the past week, states that he has a hx of chf and states the cough reminds him of that or bronchitis, pt states that his chest is sore from coughing. Pt denies any fever. Pt denies any swelling in his legs, but states he feels his abd is swollen

## 2021-05-18 NOTE — ED Notes (Signed)
Says he really does not want to do covid test.  Will inform dr Tamala Julian.

## 2021-06-17 ENCOUNTER — Other Ambulatory Visit: Payer: Self-pay

## 2021-06-17 ENCOUNTER — Encounter: Payer: Self-pay | Admitting: Emergency Medicine

## 2021-06-17 ENCOUNTER — Emergency Department: Payer: Self-pay

## 2021-06-17 ENCOUNTER — Inpatient Hospital Stay
Admission: EM | Admit: 2021-06-17 | Discharge: 2021-06-19 | DRG: 202 | Disposition: A | Discharge status: Home / Self Care | Payer: Self-pay | Attending: Family Medicine | Admitting: Family Medicine

## 2021-06-17 DIAGNOSIS — F17210 Nicotine dependence, cigarettes, uncomplicated: Secondary | ICD-10-CM | POA: Diagnosis present

## 2021-06-17 DIAGNOSIS — J209 Acute bronchitis, unspecified: Principal | ICD-10-CM | POA: Diagnosis present

## 2021-06-17 DIAGNOSIS — I5022 Chronic systolic (congestive) heart failure: Secondary | ICD-10-CM | POA: Diagnosis present

## 2021-06-17 DIAGNOSIS — Z6841 Body Mass Index (BMI) 40.0 and over, adult: Secondary | ICD-10-CM

## 2021-06-17 DIAGNOSIS — J449 Chronic obstructive pulmonary disease, unspecified: Secondary | ICD-10-CM

## 2021-06-17 DIAGNOSIS — J4541 Moderate persistent asthma with (acute) exacerbation: Secondary | ICD-10-CM

## 2021-06-17 DIAGNOSIS — J441 Chronic obstructive pulmonary disease with (acute) exacerbation: Secondary | ICD-10-CM | POA: Diagnosis present

## 2021-06-17 DIAGNOSIS — R0902 Hypoxemia: Secondary | ICD-10-CM

## 2021-06-17 DIAGNOSIS — Z20822 Contact with and (suspected) exposure to covid-19: Secondary | ICD-10-CM | POA: Diagnosis present

## 2021-06-17 DIAGNOSIS — Z79899 Other long term (current) drug therapy: Secondary | ICD-10-CM

## 2021-06-17 DIAGNOSIS — I428 Other cardiomyopathies: Secondary | ICD-10-CM | POA: Diagnosis present

## 2021-06-17 DIAGNOSIS — I110 Hypertensive heart disease with heart failure: Secondary | ICD-10-CM | POA: Diagnosis present

## 2021-06-17 DIAGNOSIS — J9601 Acute respiratory failure with hypoxia: Secondary | ICD-10-CM | POA: Diagnosis present

## 2021-06-17 LAB — BASIC METABOLIC PANEL
Anion gap: 7 (ref 5–15)
BUN: 15 mg/dL (ref 6–20)
CO2: 30 mmol/L (ref 22–32)
Calcium: 8.9 mg/dL (ref 8.9–10.3)
Chloride: 100 mmol/L (ref 98–111)
Creatinine, Ser: 1.17 mg/dL (ref 0.61–1.24)
GFR, Estimated: >60 mL/min (ref >60)
Glucose, Bld: 102 mg/dL — ABNORMAL HIGH (ref 70–99)
Potassium: 3.6 mmol/L (ref 3.5–5.1)
Sodium: 137 mmol/L (ref 135–145)

## 2021-06-17 LAB — BLOOD GAS, VENOUS
Acid-Base Excess: 8.2 mmol/L — ABNORMAL HIGH (ref 0.0–2.0)
Bicarbonate: 34 mmol/L — ABNORMAL HIGH (ref 20.0–28.0)
O2 Saturation: 97.6 %
Patient temperature: 37
pCO2, Ven: 50 mmHg (ref 44.0–60.0)
pH, Ven: 7.44 — ABNORMAL HIGH (ref 7.250–7.430)
pO2, Ven: 95 mmHg — ABNORMAL HIGH (ref 32.0–45.0)

## 2021-06-17 LAB — BRAIN NATRIURETIC PEPTIDE: B Natriuretic Peptide: 32.2 pg/mL (ref 0.0–100.0)

## 2021-06-17 LAB — CBC
HCT: 44.2 % (ref 39.0–52.0)
Hemoglobin: 13.9 g/dL (ref 13.0–17.0)
MCH: 28.7 pg (ref 26.0–34.0)
MCHC: 31.4 g/dL (ref 30.0–36.0)
MCV: 91.3 fL (ref 80.0–100.0)
Platelets: 337 10*3/uL (ref 150–400)
RBC: 4.84 MIL/uL (ref 4.22–5.81)
RDW: 13.2 % (ref 11.5–15.5)
WBC: 13 10*3/uL — ABNORMAL HIGH (ref 4.0–10.5)
nRBC: 0 % (ref 0.0–0.2)

## 2021-06-17 LAB — PROCALCITONIN: Procalcitonin: <0.1 ng/mL

## 2021-06-17 LAB — TROPONIN I (HIGH SENSITIVITY): Troponin I (High Sensitivity): 10 ng/L (ref <18)

## 2021-06-17 MED ORDER — ALBUTEROL SULFATE (2.5 MG/3ML) 0.083% IN NEBU
5.0000 mg | INHALATION_SOLUTION | Freq: Once | RESPIRATORY_TRACT | Status: AC
  Administered 2021-06-17: 5 mg via RESPIRATORY_TRACT
  Filled 2021-06-17: qty 6

## 2021-06-17 MED ORDER — ALBUTEROL SULFATE (2.5 MG/3ML) 0.083% IN NEBU
2.5000 mg | INHALATION_SOLUTION | Freq: Once | RESPIRATORY_TRACT | Status: AC
Start: 2021-06-17 — End: 2021-06-17
  Administered 2021-06-17: 2.5 mg via RESPIRATORY_TRACT
  Filled 2021-06-17: qty 3

## 2021-06-17 MED ORDER — AZITHROMYCIN 250 MG PO TABS
500.0000 mg | ORAL_TABLET | Freq: Every day | ORAL | Status: DC
  Administered 2021-06-17 – 2021-06-19 (×3): 500 mg via ORAL
  Filled 2021-06-17: qty 2
  Filled 2021-06-17: qty 1
  Filled 2021-06-17: qty 2

## 2021-06-17 MED ORDER — IPRATROPIUM-ALBUTEROL 0.5-2.5 (3) MG/3ML IN SOLN
3.0000 mL | Freq: Once | RESPIRATORY_TRACT | Status: AC
Start: 2021-06-17 — End: 2021-06-17
  Administered 2021-06-17: 3 mL via RESPIRATORY_TRACT
  Filled 2021-06-17: qty 3

## 2021-06-17 MED ORDER — ENOXAPARIN SODIUM 60 MG/0.6ML IJ SOSY
0.5000 mg/kg | PREFILLED_SYRINGE | INTRAMUSCULAR | Status: DC
  Filled 2021-06-17 (×2): qty 0.6

## 2021-06-17 MED ORDER — IPRATROPIUM-ALBUTEROL 0.5-2.5 (3) MG/3ML IN SOLN
3.0000 mL | Freq: Once | RESPIRATORY_TRACT | Status: AC
  Administered 2021-06-17: 3 mL via RESPIRATORY_TRACT
  Filled 2021-06-17: qty 3

## 2021-06-17 MED ORDER — PREDNISONE 20 MG PO TABS
40.0000 mg | ORAL_TABLET | Freq: Every day | ORAL | Status: DC
  Administered 2021-06-18: 40 mg via ORAL
  Filled 2021-06-17: qty 2

## 2021-06-17 MED ORDER — BUDESONIDE 0.5 MG/2ML IN SUSP
2.0000 mg | Freq: Two times a day (BID) | RESPIRATORY_TRACT | Status: DC
  Administered 2021-06-17 – 2021-06-18 (×3): 2 mg via RESPIRATORY_TRACT
  Administered 2021-06-18: 0.5 mg via RESPIRATORY_TRACT
  Administered 2021-06-19: 2 mg via RESPIRATORY_TRACT
  Filled 2021-06-17 (×5): qty 8

## 2021-06-17 MED ORDER — METOPROLOL SUCCINATE ER 100 MG PO TB24
200.0000 mg | ORAL_TABLET | Freq: Every day | ORAL | Status: DC
Start: 2021-06-17 — End: 2021-06-19
  Administered 2021-06-17 – 2021-06-19 (×3): 200 mg via ORAL
  Filled 2021-06-17: qty 4
  Filled 2021-06-17 (×2): qty 2

## 2021-06-17 MED ORDER — NICOTINE 14 MG/24HR TD PT24
14.0000 mg | MEDICATED_PATCH | Freq: Every day | TRANSDERMAL | Status: DC
  Filled 2021-06-17 (×3): qty 1

## 2021-06-17 MED ORDER — METHYLPREDNISOLONE SODIUM SUCC 125 MG IJ SOLR
125.0000 mg | INTRAMUSCULAR | Status: AC
  Administered 2021-06-17: 125 mg via INTRAVENOUS
  Filled 2021-06-17: qty 2

## 2021-06-17 MED ORDER — ENOXAPARIN SODIUM 40 MG/0.4ML IJ SOSY: 40.0000 mg | PREFILLED_SYRINGE | INTRAMUSCULAR | Status: DC

## 2021-06-17 MED ORDER — SPIRONOLACTONE 25 MG PO TABS
25.0000 mg | ORAL_TABLET | Freq: Every day | ORAL | Status: DC
  Administered 2021-06-17 – 2021-06-19 (×3): 25 mg via ORAL
  Filled 2021-06-17 (×3): qty 1

## 2021-06-17 MED ORDER — ATORVASTATIN CALCIUM 10 MG PO TABS
10.0000 mg | ORAL_TABLET | Freq: Every day | ORAL | Status: DC
  Administered 2021-06-17 – 2021-06-19 (×3): 10 mg via ORAL
  Filled 2021-06-17 (×3): qty 1

## 2021-06-17 MED ORDER — FUROSEMIDE 10 MG/ML IJ SOLN
40.0000 mg | Freq: Once | INTRAMUSCULAR | Status: AC
Start: 2021-06-17 — End: 2021-06-17
  Administered 2021-06-17: 40 mg via INTRAVENOUS
  Filled 2021-06-17: qty 4

## 2021-06-17 MED ORDER — IPRATROPIUM-ALBUTEROL 0.5-2.5 (3) MG/3ML IN SOLN
3.0000 mL | Freq: Four times a day (QID) | RESPIRATORY_TRACT | Status: DC
  Administered 2021-06-17 – 2021-06-18 (×6): 3 mL via RESPIRATORY_TRACT
  Filled 2021-06-17 (×6): qty 3

## 2021-06-17 MED ORDER — FUROSEMIDE 40 MG PO TABS
40.0000 mg | ORAL_TABLET | Freq: Every day | ORAL | Status: DC
  Administered 2021-06-18 – 2021-06-19 (×2): 40 mg via ORAL
  Filled 2021-06-17 (×2): qty 1

## 2021-06-17 MED ORDER — LOSARTAN POTASSIUM 50 MG PO TABS
100.0000 mg | ORAL_TABLET | Freq: Every day | ORAL | Status: DC
Start: 2021-06-17 — End: 2021-06-19
  Administered 2021-06-17 – 2021-06-19 (×3): 100 mg via ORAL
  Filled 2021-06-17 (×3): qty 2

## 2021-06-17 NOTE — Progress Notes (Signed)
PHARMACIST - PHYSICIAN COMMUNICATION  CONCERNING:  Enoxaparin (Lovenox) for DVT Prophylaxis   RECOMMENDATION: Patient was prescribed enoxaprin 40mg  q24 hours for VTE prophylaxis.   Filed Weights   06/17/21 0741  Weight: 116.1 kg (256 lb)    Body mass index is 42.6 kg/m.  Estimated Creatinine Clearance: 90 mL/min (by C-G formula based on SCr of 1.17 mg/dL).   Based on [place] patient is candidate for enoxaparin 0.5mg /kg TBW SQ every 24 hours based on BMI being >30.  DESCRIPTION: Pharmacy has adjusted enoxaparin dose per [facility] policy.  Patient is now receiving enoxaparin 57.5 mg every 24 hours    Pernell Dupre, PharmD, BCPS Clinical Pharmacist 06/17/2021 1:38 PM

## 2021-06-17 NOTE — H&P (Signed)
History and Physical    Michael Ross LDJ:[id number] DOB: 09/28/1970 DOA: 06/17/2021  [facility] (Confirm with patient/family/NH records and if not entered, this has to be entered at [facility] point of entry) Patient coming from: Home  I have personally briefly reviewed patient's old medical records in [place]  Chief Complaint: Cough, wheezing  HPI: Michael Ross is a 51 y.o. male with medical history significant of chronic systolic CHF with recovered LVEF 55% 2021, HTN, cigarette smoker, presented with worsening of wheezing cough shortness of breath.  Symptoms started 5 to 6 weeks ago, initially was dry cough then gradually had some small amount of whitish sputum production, no chest pain no fever or chills. He came to the ED on January 3, with a symptoms of cough and pleural chest pain, was treated with albuterol and sent home with as needed albuterol and azithromycin.  Patient completed course of p.o. antibiotics and breathing treatment, at home however he continued experience episodes of cough wheezing and shortness of breath.  Denies any swelling,   ED Course: Observation 88% on room air, no wheezing crackles on physical exam, patient was given Lasix and breathing treatment.  However still remain tachypneic and O2 saturation dropped to 89% with ambulation.  Chest x-ray no acute infiltrates.  BNP remain within normal limits.  Review of Systems: As per HPI otherwise 14 point review of systems negative.    Past Medical History:  Diagnosis Date   CHF (congestive heart failure) ([place])    Hypertension     Past Surgical History:  Procedure Laterality Date   CARDIAC CATHETERIZATION       reports that he has been smoking cigarettes. He has been smoking an average of .5 packs per day. He has never used smokeless tobacco. He reports current alcohol use. He reports that he does not use drugs.  Allergies  Allergen Reactions   Lisinopril Swelling    angioedema    History  reviewed. No pertinent family history.   Prior to Admission medications   Medication Sig Start Date End Date Taking? Authorizing Provider  albuterol (VENTOLIN HFA) 108 (90 Base) MCG/ACT inhaler Inhale 2 puffs into the lungs every 6 (six) hours as needed for wheezing or shortness of breath. 05/18/21  Yes Blake Divine, MD  atorvastatin (LIPITOR) 10 MG tablet Take 10 mg by mouth daily. 04/15/16 06/17/21 Yes [provider]  furosemide (LASIX) 40 MG tablet Take 40 mg by mouth daily. 04/16/16 05/16/16 Yes [provider]  metoprolol (TOPROL-XL) 200 MG 24 hr tablet Take 200 mg by mouth daily. 05/03/21  Yes [provider]  spironolactone (ALDACTONE) 25 MG tablet Take 25 mg by mouth daily. 06/06/21  Yes [provider]  azithromycin (ZITHROMAX Z-PAK) 250 MG tablet Take 2 tablets (500 mg) on  Day 1,  followed by 1 tablet (250 mg) once daily on Days 2 through 5. Patient not taking: Reported on 06/17/2021 09/24/20   Carrie Mew, MD  benzonatate (TESSALON PERLES) 100 MG capsule Take 1 capsule (100 mg total) by mouth 3 (three) times daily as needed for cough. Patient not taking: Reported on 06/17/2021 07/14/20 07/14/21  Vanessa [place], MD  chlorpheniramine-HYDROcodone [facility] PENNKINETIC ER) 10-8 MG/5ML SUER Take 5 mLs by mouth every 12 (twelve) hours as needed for cough. Patient not taking: Reported on 06/17/2021 05/18/21   Blake Divine, MD  ketorolac (ACULAR) 0.5 % ophthalmic solution Place 1 drop into the right eye 4 (four) times daily. Patient not taking: Reported on 06/17/2021  04/22/21   Cuthriell, Charline Bills, PA-C  losartan (COZAAR) 100 MG tablet Take 100 mg by mouth daily. 06/06/21   [provider]  sildenafil (VIAGRA) 100 MG tablet Take 100 mg by mouth daily as needed. 12/20/20   [provider]  trimethoprim-polymyxin b (POLYTRIM) ophthalmic solution Place 2 drops into the left eye every 6 (six) hours. Patient not taking: Reported on 06/17/2021 04/22/21    Brynda Peon    Physical Exam: Vitals:   06/17/21 8937 06/17/21 0830 06/17/21 1000 06/17/21 1046  BP:    132/85  Pulse: 76 87 85 88  Resp: 15 18 (!) 21 (!) 21  SpO2: 96% 95% 95% 93%  Weight:      Height:        Constitutional: NAD, calm, comfortable Vitals:   06/17/21 0812 06/17/21 0830 06/17/21 1000 06/17/21 1046  BP:    132/85  Pulse: 76 87 85 88  Resp: 15 18 (!) 21 (!) 21  SpO2: 96% 95% 95% 93%  Weight:      Height:       Eyes: PERRL, lids and conjunctivae normal ENMT: Mucous membranes are moist. Posterior pharynx clear of any exudate or lesions.Normal dentition.  Neck: normal, supple, no masses, no thyromegaly Respiratory: Diminished exhale bilaterally, scattered wheezing, no crackles.  Increasing respiratory effort. No accessory muscle use.  Cardiovascular: Regular rate and rhythm, no murmurs / rubs / gallops. No extremity edema. 2+ pedal pulses. No carotid bruits.  Abdomen: no tenderness, no masses palpated. No hepatosplenomegaly. Bowel sounds positive.  Musculoskeletal: no clubbing / cyanosis. No joint deformity upper and lower extremities. Good ROM, no contractures. Normal muscle tone.  Skin: no rashes, lesions, ulcers. No induration Neurologic: CN 2-12 grossly intact. Sensation intact, DTR normal. Strength 5/5 in all 4.  Psychiatric: Normal judgment and insight. Alert and oriented x 3. Normal mood.     Labs on Admission: I have personally reviewed following labs and imaging studies  CBC: Recent Labs  Lab 06/17/21 0811  WBC 13.0*  HGB 13.9  HCT 44.2  MCV 91.3  PLT 342   Basic Metabolic Panel: Recent Labs  Lab 06/17/21 0811  NA 137  K 3.6  CL 100  CO2 30  GLUCOSE 102*  BUN 15  CREATININE 1.17  CALCIUM 8.9   GFR: Estimated Creatinine Clearance: 90 mL/min (by C-G formula based on SCr of 1.17 mg/dL). Liver Function Tests: No results for input(s): AST, ALT, ALKPHOS, BILITOT, PROT, ALBUMIN in the last 168 hours. No results for  input(s): LIPASE, AMYLASE in the last 168 hours. No results for input(s): AMMONIA in the last 168 hours. Coagulation Profile: No results for input(s): INR, PROTIME in the last 168 hours. Cardiac Enzymes: No results for input(s): CKTOTAL, CKMB, CKMBINDEX, TROPONINI in the last 168 hours. BNP (last 3 results) No results for input(s): PROBNP in the last 8760 hours. HbA1C: No results for input(s): HGBA1C in the last 72 hours. CBG: No results for input(s): GLUCAP in the last 168 hours. Lipid Profile: No results for input(s): CHOL, HDL, LDLCALC, TRIG, CHOLHDL, LDLDIRECT in the last 72 hours. Thyroid Function Tests: No results for input(s): TSH, T4TOTAL, FREET4, T3FREE, THYROIDAB in the last 72 hours. Anemia Panel: No results for input(s): VITAMINB12, FOLATE, FERRITIN, TIBC, IRON, RETICCTPCT in the last 72 hours. Urine analysis:    Component Value Date/Time   COLORURINE YELLOW (A) 03/27/2021 1203   APPEARANCEUR CLEAR (A) 03/27/2021 1203   LABSPEC 1.021 03/27/2021 1203   PHURINE 5.0 03/27/2021 1203  GLUCOSEU NEGATIVE 03/27/2021 [place] 03/27/2021 [place] 03/27/2021 1203   [place] 03/27/2021 1203   PROTEINUR 30 (A) 03/27/2021 1203   NITRITE NEGATIVE 03/27/2021 [place] 03/27/2021 1203    Radiological Exams on Admission: DG Chest 2 View  Result Date: 06/17/2021 CLINICAL DATA:  Cough, wheezing, dyspnea EXAM: CHEST - 2 VIEW COMPARISON:  05/18/2021 chest radiograph. FINDINGS: Stable cardiomediastinal silhouette with normal heart size. No pneumothorax. No pleural effusion. No pulmonary edema. No acute consolidative airspace disease. Minimal linear scarring at the left lung base is unchanged. IMPRESSION: No active cardiopulmonary disease. Electronically Signed   By: Ilona Sorrel M.D.   On: 06/17/2021 08:12    EKG: Independently reviewed.  Sinus, no acute ST changes.  Assessment/Plan Principal Problem:   COPD (chronic  obstructive pulmonary disease) (HCC) Active Problems:   Hypoxia  (please populate well all problems here in Problem List. (For example, if patient is on BP meds at home and you resume or decide to hold them, it is a problem that needs to be her. Same for CAD, COPD, HLD and so on)  Acute hypoxic respiratory failure -Likely acute bronchitis, given his outpatient clinical course, suspect underlying asthma versus COPD, check VBG to rule out CO2 retention. -Will treat as COPD/asthma exacerbation, short course of p.o. steroid, azithromycin, breathing treatment with Pulmicort twice daily.  Outpatient PFT. -Baseline procalcitonin -Other DDx, acute CHF decompensation less likely, given his clinical insurance no significant signs of fluid overload.  We will continue home dose of p.o. Lasix.  Outpatient follow-up with cardiologist at [facility].  Leukocytosis -No overt signs of pneumonia on x-ray, check atypical infection Legionella and mycoplasma antigen -Azithromycin.  Chronic systolic CHF -To be euvolemic, continue home CHF medications with p.o. Lasix. -UDS  Cigarette smoker -Cessation consultation -Nicotine patch for now.  DVT prophylaxis: Lovenox Code Status: Full code Family Communication: None at bedside Disposition Plan: Expect less than 2 midnight hospital stay Consults called: None Admission status: MedSurg observation   Lequita Halt MD [facility] Pager [phone]  06/17/2021, 1:32 PM

## 2021-06-17 NOTE — ED Triage Notes (Signed)
Pt comes into the ED via POV c/o cough and wheezing.  PT states he was seen 6 weeks ago and diagnosed with bronchitis, but he states he hasnt gotten better and in fact he feels as though it has gotten worse.  Pt states he is a CHF patient and he feels like he is retaining fluid.  Pt admits his abdomen is more distended then normal.  Pt also admits to chest tightness and dizziness at times.  Pt is currently on Lasix but feels like it isnt working.

## 2021-06-17 NOTE — ED Notes (Signed)
Dr. Quale at bedside.  

## 2021-06-17 NOTE — ED Notes (Signed)
Pt taken for xray

## 2021-06-17 NOTE — ED Notes (Signed)
Pt placed on 2L [place]

## 2021-06-17 NOTE — ED Provider Notes (Signed)
[facility] Provider Note    Event Date/Time   First MD Initiated Contact with Patient 06/17/21 [phone]     (approximate)   History   Cough and Congestive Heart Failure   HPI  Michael Ross is a 51 y.o. male history of congestive heart failure, nonischemic cardiomyopathy with 40 mg daily Lasix use, as well as a history of recurrent bronchitis  He has been having about 2 months of intermittent coughing wheezing and shortness of breath.  He was seen in the ER for this, and was here about a month ago for the same.  He reports it helps when he was given an inhaler, has been using that regularly but it seems to be not helping as much.  He is also noticed that he feels slightly more swollen than typical mostly around his abdomen which she reports is typical of his CHF and he has noticed that he is having to sleep propped up now on several pillows and feels like when he takes his daily Lasix dose its not being as productive and he is not urinating as much as typical  He reports he has been feeling wheezing cough.  No chest pain.  No nausea or vomiting.  No fevers or chills.  Reports in the past that this seems to clear up after he takes a Z-Pak but also has a history of CHF    Personally reviewed the patient's previous office visit from July 2021 with [facility] cardiology patient has EF approximately 50% at that time.     Physical Exam   Triage Vital Signs: ED Triage Vitals  Enc Vitals Group     BP 06/17/21 0750 133/84     Pulse Rate 06/17/21 0750 72     Resp 06/17/21 0812 15     Temp --      Temp src --      SpO2 06/17/21 0750 (!) 88 %     Weight 06/17/21 0741 256 lb (116.1 kg)     Height 06/17/21 0741 5\' 5"  (1.651 m)     Head Circumference --      Peak Flow --      Pain Score 06/17/21 0741 8     Pain Loc --      Pain Edu? --      Excl. in [place]? --     Most recent vital signs: Vitals:   06/17/21 1000 06/17/21 1046  BP:  132/85  Pulse: 85 88  Resp: (!)  21 (!) 21  Temp:    SpO2: 95% 93%     General: Awake, no distress.  Oxygen saturation 94% on 2 L.  I did trial him on room air and his oxygen saturation dropped to 88 to 89% CV:  Good peripheral perfusion.  Normal heart tones.  No murmurs or rubs. Resp:  Normal effort.  There is moderate expiratory wheezing in all fields.  Occasional dry cough.  No crackles or denoted in the fields. Abd:  No distention.  He is obese centrally.  There is no peripheral edema Other:  No JVD, no peripheral edema.  Patient reports that he never has a history of swelling in his legs and always occurs in his abdomen when he has CHF   ED Results / Procedures / Treatments   Labs (all labs ordered are listed, but only abnormal results are displayed) Labs Reviewed  BASIC METABOLIC PANEL - Abnormal; Notable for the following components:      Result Value  Glucose, Bld 102 (*)    All other components within normal limits  CBC - Abnormal; Notable for the following components:   WBC 13.0 (*)    All other components within normal limits  BLOOD GAS, VENOUS - Abnormal; Notable for the following components:   pH, Ven 7.44 (*)    pO2, Ven 95.0 (*)    Bicarbonate 34.0 (*)    Acid-Base Excess 8.2 (*)    All other components within normal limits  BRAIN NATRIURETIC PEPTIDE  URINE DRUG SCREEN, QUALITATIVE ([facility] ONLY)  HIV ANTIBODY (ROUTINE TESTING W REFLEX)  MYCOPLASMA PNEUMONIAE ANTIBODY, IGM  LEGIONELLA PNEUMOPHILA SEROGP 1 UR AG  PROCALCITONIN  TROPONIN I (HIGH SENSITIVITY)     EKG  Reviewed inter by me at 750 Heart rate 75 QRS 110 QTc 460 Normal sinus rhythm, mild baseline wander.  No frank evidence of acute ischemia denoted   RADIOLOGY  I personally viewed and interpreted the patient's chest x-ray, clear lungs.  Perhaps some very mild linear atelectasis of the left lung     PROCEDURES:  Critical Care performed: No  Procedures   MEDICATIONS ORDERED IN ED: Medications  atorvastatin  (LIPITOR) tablet 10 mg (10 mg Oral Given 06/17/21 1417)  furosemide (LASIX) tablet 40 mg (has no administration in time range)  losartan (COZAAR) tablet 100 mg (100 mg Oral Given 06/17/21 1417)  metoprolol succinate (TOPROL-XL) 24 hr tablet 200 mg (200 mg Oral Given 06/17/21 1417)  spironolactone (ALDACTONE) tablet 25 mg (25 mg Oral Given 06/17/21 1418)  nicotine (NICODERM CQ - dosed in mg/24 hours) patch 14 mg (14 mg Transdermal Patient Refused/Not Given 06/17/21 1427)  predniSONE (DELTASONE) tablet 40 mg (has no administration in time range)  budesonide (PULMICORT) nebulizer solution 2 mg (2 mg Nebulization Given 06/17/21 1420)  ipratropium-albuterol (DUONEB) 0.5-2.5 (3) MG/3ML nebulizer solution 3 mL (3 mLs Nebulization Given 06/17/21 1420)  azithromycin (ZITHROMAX) tablet 500 mg (500 mg Oral Given 06/17/21 1418)  enoxaparin (LOVENOX) injection 57.5 mg (has no administration in time range)  ipratropium-albuterol (DUONEB) 0.5-2.5 (3) MG/3ML nebulizer solution 3 mL (3 mLs Nebulization Given 06/17/21 0853)  ipratropium-albuterol (DUONEB) 0.5-2.5 (3) MG/3ML nebulizer solution 3 mL (3 mLs Nebulization Given 06/17/21 0852)  methylPREDNISolone sodium succinate (SOLU-MEDROL) 125 mg/2 mL injection 125 mg (125 mg Intravenous Given 06/17/21 0850)  ipratropium-albuterol (DUONEB) 0.5-2.5 (3) MG/3ML nebulizer solution 3 mL (3 mLs Nebulization Given 06/17/21 1044)  albuterol (PROVENTIL) (2.5 MG/3ML) 0.083% nebulizer solution 2.5 mg (2.5 mg Nebulization Given 06/17/21 1044)  furosemide (LASIX) injection 40 mg (40 mg Intravenous Given 06/17/21 1041)  albuterol (PROVENTIL) (2.5 MG/3ML) 0.083% nebulizer solution 5 mg (5 mg Nebulization Given 06/17/21 1420)     IMPRESSION / MDM / ASSESSMENT AND PLAN / ED COURSE  I reviewed the triage vital signs and the nursing notes.                              Differential diagnosis includes, but is not limited to, acute bronchitis, chronic bronchitis, viral syndrome though he does seem to lack runny  nose fevers body aches.  Also considered would be CHF.  Unlikely to represent ACS, also get a little bit of occasional achiness in his chest but not really chest pain has been off and on for months.  No evidence of for pulmonary embolism, no unilateral leg swelling no pleuritic pain.  Chest x-ray does not demonstrate florid pulmonary edema or obvious consolidation.  Clinical Course as of 06/17/21 1542  Thu Jun 17, 2021  1037 Patient oxygen saturation 91% on 2 L at present.  Continues with expiratory wheezing.  Will give additional nebulizer treatments.  This point seems most consistent with likely some sort of bronchitis or reactive airway or COPD type presentation.  His BNP is not elevated but he does take Lasix, will give a dose of IV Lasix. [MQ]    Clinical Course User Index [MQ] Delman Kitten, MD   Despite treatments with Lasix and nebulizers, patient continues to show increased mild work of breathing but notable ongoing expiratory wheezing.  No pain or discomfort.  Discussed with the patient, and we will admit for further care and management.  He does have a history of CHF, but I question if he may very well have COPD or underlying pulmonary disease as well but is not as of yet undiagnosed.  May need if additional work-up moving forward.    Consulted with the hospitalist Dr. Roosevelt Locks, decision made for admission  FINAL CLINICAL IMPRESSION(S) / ED DIAGNOSES   Final diagnoses:  Moderate persistent reactive airway disease with acute exacerbation     Rx / DC Orders   ED Discharge Orders     None        Note:  This document was prepared using Dragon voice recognition software and may include unintentional dictation errors.   Delman Kitten, MD 06/17/21 [phone]

## 2021-06-17 NOTE — ED Notes (Addendum)
Pt ambulatory 02 room air Sitting: 87% Standing: 88% Ambulating: 90% Sitting: 92%  Passenger transport manager notified.

## 2021-06-18 DIAGNOSIS — J441 Chronic obstructive pulmonary disease with (acute) exacerbation: Secondary | ICD-10-CM | POA: Diagnosis present

## 2021-06-18 LAB — URINE DRUG SCREEN, QUALITATIVE (ARMC ONLY)
Amphetamines, Ur Screen: NOT DETECTED
Barbiturates, Ur Screen: NOT DETECTED
Benzodiazepine, Ur Scrn: NOT DETECTED
Cannabinoid 50 Ng, Ur ~~LOC~~: NOT DETECTED
Cocaine Metabolite,Ur ~~LOC~~: NOT DETECTED
MDMA (Ecstasy)Ur Screen: NOT DETECTED
Methadone Scn, Ur: NOT DETECTED
Opiate, Ur Screen: NOT DETECTED
Phencyclidine (PCP) Ur S: NOT DETECTED
Tricyclic, Ur Screen: NOT DETECTED

## 2021-06-18 LAB — RESP PANEL BY RT-PCR (FLU A&B, COVID) ARPGX2
Influenza A by PCR: NEGATIVE
Influenza B by PCR: NEGATIVE
SARS Coronavirus 2 by RT PCR: NEGATIVE

## 2021-06-18 LAB — HIV ANTIBODY (ROUTINE TESTING W REFLEX): HIV Screen 4th Generation wRfx: NONREACTIVE

## 2021-06-18 LAB — D-DIMER, QUANTITATIVE: D-Dimer, Quant: 0.45 ug/mL-FEU (ref 0.00–0.50)

## 2021-06-18 LAB — PROCALCITONIN: Procalcitonin: <0.1 ng/mL

## 2021-06-18 MED ORDER — ALBUTEROL SULFATE HFA 108 (90 BASE) MCG/ACT IN AERS: 2.0000 | INHALATION_SPRAY | Freq: Four times a day (QID) | RESPIRATORY_TRACT | 0 refills | Status: DC | PRN

## 2021-06-18 MED ORDER — FUROSEMIDE 10 MG/ML IJ SOLN
20.0000 mg | INTRAMUSCULAR | Status: AC
  Administered 2021-06-18: 20 mg via INTRAVENOUS
  Filled 2021-06-18: qty 4

## 2021-06-18 MED ORDER — METHYLPREDNISOLONE SODIUM SUCC 125 MG IJ SOLR
80.0000 mg | INTRAMUSCULAR | Status: DC
  Administered 2021-06-18 – 2021-06-19 (×2): 80 mg via INTRAVENOUS
  Filled 2021-06-18 (×2): qty 2

## 2021-06-18 MED ORDER — BENZONATATE 100 MG PO CAPS: 100.0000 mg | ORAL_CAPSULE | Freq: Three times a day (TID) | ORAL | 0 refills | Status: AC | PRN

## 2021-06-18 MED ORDER — ATORVASTATIN CALCIUM 10 MG PO TABS: 10.0000 mg | ORAL_TABLET | Freq: Every day | ORAL | 0 refills | Status: AC

## 2021-06-18 MED ORDER — AZITHROMYCIN 250 MG PO TABS: ORAL_TABLET | ORAL | 0 refills | Status: DC

## 2021-06-18 MED ORDER — IPRATROPIUM-ALBUTEROL 0.5-2.5 (3) MG/3ML IN SOLN
3.0000 mL | Freq: Two times a day (BID) | RESPIRATORY_TRACT | Status: DC
  Administered 2021-06-19: 3 mL via RESPIRATORY_TRACT
  Filled 2021-06-18: qty 3

## 2021-06-18 MED ORDER — PREDNISONE 20 MG PO TABS: 40.0000 mg | ORAL_TABLET | Freq: Every day | ORAL | 0 refills | Status: AC

## 2021-06-18 MED ORDER — FUROSEMIDE 40 MG PO TABS: 40.0000 mg | ORAL_TABLET | Freq: Every day | ORAL | 0 refills | Status: DC

## 2021-06-18 MED ORDER — METHYLPREDNISOLONE SODIUM SUCC 40 MG IJ SOLR: 40.0000 mg | Freq: Two times a day (BID) | INTRAMUSCULAR | Status: DC

## 2021-06-18 MED ORDER — NICOTINE 14 MG/24HR TD PT24: 14.0000 mg | MEDICATED_PATCH | Freq: Every day | TRANSDERMAL | 0 refills | Status: DC

## 2021-06-18 NOTE — Progress Notes (Signed)
PHARMACIST - PHYSICIAN COMMUNICATION   CONCERNING: Methylprednisolone IV    Current order: Methylprednisolone IV 40mg  BID     DESCRIPTION: Per [place] Protocol:   IV methylprednisolone will be converted to either a q12h or q24h frequency with the same total daily dose (TDD).  Ordered Dose: 1 to 125 mg TDD; convert to: TDD q24h.  Ordered Dose: 126 to 250 mg TDD; convert to: TDD div q12h.  Ordered Dose: >250 mg TDD; DAW.  Order has been adjusted to: Methylprednisolone IV 80mg  every 24 hours    Pernell Dupre , PharmD, BCPS Clinical Pharmacist  06/18/2021 10:58 AM

## 2021-06-18 NOTE — Progress Notes (Signed)
SATURATION QUALIFICATIONS: (This note is used to comply with regulatory documentation for home oxygen)  Patient Saturations on Room Air at Rest = 95%  Patient Saturations on Room Air while Ambulating = 85%  Patient Saturations on 0 Liters of oxygen while Ambulating = 85-90%  Please briefly explain why patient needs home oxygen: Patient needs home oxygen because while ambulating on RA patient de-satted to 85% requiring 1L of O2 to adequately recover and maintain O2 sat above 90%.

## 2021-06-18 NOTE — Progress Notes (Addendum)
PROGRESS NOTE  Michael Ross CNO:[id number] DOB: 02/05/1971 DOA: 06/17/2021 [facility]  HPI/Recap of past 24 hours:  Michael Ross is a 51 y.o. male with medical history significant of chronic systolic CHF with recovered LVEF 55% in 2021, HTN, former tobacco user, quit 3 weeks ago, presented to [facility] ED with worsening wheezing cough shortness of breath of 5 weeks duration.  Work-up revealed acute hypoxic respiratory failure secondary to acute COPD exacerbation.  06/18/2021: Patient was seen and examined at his bedside.  Mild wheezing noted bilaterally.  Failed home oxygen evaluation with O2 saturation 85% on room air with ambulation.  Started IV Solu-Medrol 40 mg twice daily.  Received a dose of IV Lasix.    Assessment/Plan: Principal Problem:   COPD (chronic obstructive pulmonary disease) (HCC) Active Problems:   Hypoxia   Acute exacerbation of chronic obstructive pulmonary disease (COPD) (HCC)   Acute COPD exacerbation, unclear trigger COVID-19, influenza A, influenza B all negative Quit smoking 3 weeks ago Endorses brief exposure to secondhand smoking Switch prednisone to IV Solu-Medrol 40 mg twice daily Start bronchodilators Received 1 dose of IV Lasix 20 milligrams x1 Continue azithromycin and pulmonary toilet Mobilize as tolerated  Acute hypoxic respiratory failure secondary to above No oxygen supplementation at baseline Currently requiring 1 to 2 L to maintain a saturation greater than 90% Wean off oxygen supplementation as tolerated. Continue management as stated above.  Leukocytosis likely reactive in the setting of steroid use Procalcitonin negative Afebrile, no evidence of pneumonia on chest x-ray. Continue to monitor  Tobacco use disorder Recently quit 3 weeks ago, encouraged to continue to abstain from tobacco use Endorses brief exposure to secondhand smoking  Morbid obesity BMI 42 Encouraged weight loss with regular physical activity and healthy  dieting.  Code Status: Full code  Family Communication: None at bedside  Disposition Plan: Likely discharge in the next 24 to 48 hours when symptomatology has improved and no longer requiring oxygen supplementation.   Consultants: None  Procedures: None  Antimicrobials: Azithromycin  DVT prophylaxis: Subcu Lovenox daily  Status is: Inpatient  Patient requires at least 2 midnights for further evaluation and treatment of present condition.      Objective: Vitals:   06/18/21 0758 06/18/21 0818 06/18/21 1405 06/18/21 1534  BP:  105/60  (!) 105/59  Pulse:  81  74  Resp:  18  18  Temp:  97.7 F (36.5 C)  98.7 F (37.1 C)  TempSrc:  Oral  Oral  SpO2: 96% 97% 92% 96%  Weight:      Height:        Intake/Output Summary (Last 24 hours) at 06/18/2021 1610 Last data filed at 06/18/2021 1409 Gross per 24 hour  Intake 480 ml  Output 0 ml  Net 480 ml   Filed Weights   06/17/21 0741  Weight: 116.1 kg    Exam:  General: 51 y.o. year-old male well developed well nourished in no acute distress.  Alert and oriented x3. Cardiovascular: Regular rate and rhythm with no rubs or gallops.  No thyromegaly or JVD noted.   Respiratory: Mild diffuse wheezing bilaterally.  Poor inspiratory effort. Abdomen: Soft nontender nondistended with normal bowel sounds x4 quadrants. Musculoskeletal: No lower extremity edema. 2/4 pulses in all 4 extremities. Skin: No ulcerative lesions noted or rashes, Psychiatry: Mood is appropriate for condition and setting   Data Reviewed: CBC: Recent Labs  Lab 06/17/21 0811  WBC 13.0*  HGB 13.9  HCT 44.2  MCV 91.3  PLT 337  Basic Metabolic Panel: Recent Labs  Lab 06/17/21 0811  NA 137  K 3.6  CL 100  CO2 30  GLUCOSE 102*  BUN 15  CREATININE 1.17  CALCIUM 8.9   GFR: Estimated Creatinine Clearance: 90 mL/min (by C-G formula based on SCr of 1.17 mg/dL). Liver Function Tests: No results for input(s): AST, ALT, ALKPHOS, BILITOT, PROT,  ALBUMIN in the last 168 hours. No results for input(s): LIPASE, AMYLASE in the last 168 hours. No results for input(s): AMMONIA in the last 168 hours. Coagulation Profile: No results for input(s): INR, PROTIME in the last 168 hours. Cardiac Enzymes: No results for input(s): CKTOTAL, CKMB, CKMBINDEX, TROPONINI in the last 168 hours. BNP (last 3 results) No results for input(s): PROBNP in the last 8760 hours. HbA1C: No results for input(s): HGBA1C in the last 72 hours. CBG: No results for input(s): GLUCAP in the last 168 hours. Lipid Profile: No results for input(s): CHOL, HDL, LDLCALC, TRIG, CHOLHDL, LDLDIRECT in the last 72 hours. Thyroid Function Tests: No results for input(s): TSH, T4TOTAL, FREET4, T3FREE, THYROIDAB in the last 72 hours. Anemia Panel: No results for input(s): VITAMINB12, FOLATE, FERRITIN, TIBC, IRON, RETICCTPCT in the last 72 hours. Urine analysis:    Component Value Date/Time   COLORURINE YELLOW (A) 03/27/2021 1203   APPEARANCEUR CLEAR (A) 03/27/2021 1203   LABSPEC 1.021 03/27/2021 1203   PHURINE 5.0 03/27/2021 1203   GLUCOSEU NEGATIVE 03/27/2021 1203   HGBUR NEGATIVE 03/27/2021 1203   [place] 03/27/2021 1203   KETONESUR NEGATIVE 03/27/2021 1203   PROTEINUR 30 (A) 03/27/2021 1203   NITRITE NEGATIVE 03/27/2021 1203   LEUKOCYTESUR NEGATIVE 03/27/2021 1203   Sepsis Labs: @LABRCNTIP (procalcitonin:4,lacticidven:4)  ) Recent Results (from the past 240 hour(s))  Resp Panel by RT-PCR (Flu A&B, Covid) Nasopharyngeal Swab     Status: None   Collection Time: 06/17/21 11:48 PM   Specimen: Nasopharyngeal Swab; Nasopharyngeal(NP) swabs in vial transport medium  Result Value Ref Range Status   SARS Coronavirus 2 by RT PCR NEGATIVE NEGATIVE Final    Comment: (NOTE) SARS-CoV-2 target nucleic acids are NOT DETECTED.  The SARS-CoV-2 RNA is generally detectable in upper respiratory specimens during the acute phase of infection. The lowest concentration of  SARS-CoV-2 viral copies this assay can detect is 138 copies/mL. A negative result does not preclude SARS-Cov-2 infection and should not be used as the sole basis for treatment or other patient management decisions. A negative result may occur with  improper specimen collection/handling, submission of specimen other than nasopharyngeal swab, presence of viral mutation(s) within the areas targeted by this assay, and inadequate number of viral copies(<138 copies/mL). A negative result must be combined with clinical observations, patient history, and epidemiological information. The expected result is Negative.  Fact Sheet for Patients:  EntrepreneurPulse.com.au  Fact Sheet for Healthcare Providers:  IncredibleEmployment.be  This test is no t yet approved or cleared by the [place] FDA and  has been authorized for detection and/or diagnosis of SARS-CoV-2 by FDA under an Emergency Use Authorization (EUA). This EUA will remain  in effect (meaning this test can be used) for the duration of the COVID-19 declaration under Section 564(b)(1) of the Act, 21 U.S.C.section 360bbb-3(b)(1), unless the authorization is terminated  or revoked sooner.       Influenza A by PCR NEGATIVE NEGATIVE Final   Influenza B by PCR NEGATIVE NEGATIVE Final    Comment: (NOTE) The Xpert Xpress SARS-CoV-2/FLU/RSV plus assay is intended as an aid in the diagnosis of influenza from  Nasopharyngeal swab specimens and should not be used as a sole basis for treatment. Nasal washings and aspirates are unacceptable for Xpert Xpress SARS-CoV-2/FLU/RSV testing.  Fact Sheet for Patients: EntrepreneurPulse.com.au  Fact Sheet for Healthcare Providers: IncredibleEmployment.be  This test is not yet approved or cleared by the [place] FDA and has been authorized for detection and/or diagnosis of SARS-CoV-2 by FDA under an Emergency Use  Authorization (EUA). This EUA will remain in effect (meaning this test can be used) for the duration of the COVID-19 declaration under Section 564(b)(1) of the Act, 21 U.S.C. section 360bbb-3(b)(1), unless the authorization is terminated or revoked.  Performed at [facility], [address] [place], [place] [address]       Studies: No results found.  Scheduled Meds:  atorvastatin  10 mg Oral Daily   azithromycin  500 mg Oral Daily   budesonide (PULMICORT) nebulizer solution  2 mg Nebulization Q12H   enoxaparin (LOVENOX) injection  0.5 mg/kg Subcutaneous Q24H   furosemide  40 mg Oral Daily   ipratropium-albuterol  3 mL Nebulization Q6H   losartan  100 mg Oral Daily   methylPREDNISolone (SOLU-MEDROL) injection  80 mg Intravenous Q24H   metoprolol  200 mg Oral Daily   nicotine  14 mg Transdermal Daily   spironolactone  25 mg Oral Daily    Continuous Infusions:   LOS: 0 days     Kayleen Memos, MD [facility] Pager [phone]  If 7PM-7AM, please contact night-coverage [email or web address] Password [email or web address] 06/18/2021, 4:10 PM

## 2021-06-19 DIAGNOSIS — J209 Acute bronchitis, unspecified: Principal | ICD-10-CM

## 2021-06-19 DIAGNOSIS — J9601 Acute respiratory failure with hypoxia: Secondary | ICD-10-CM

## 2021-06-19 DIAGNOSIS — I5022 Chronic systolic (congestive) heart failure: Secondary | ICD-10-CM

## 2021-06-19 MED ORDER — FUROSEMIDE 40 MG PO TABS: 40.0000 mg | ORAL_TABLET | Freq: Two times a day (BID) | ORAL | 0 refills | Status: DC

## 2021-06-19 MED ORDER — BUDESONIDE 0.5 MG/2ML IN SUSP: 0.5000 mg | Freq: Two times a day (BID) | RESPIRATORY_TRACT | Status: DC

## 2021-06-19 MED ORDER — AZITHROMYCIN 500 MG PO TABS: ORAL_TABLET | ORAL | 0 refills | Status: DC

## 2021-06-19 NOTE — Plan of Care (Signed)
°  Problem: Education: Goal: Knowledge of disease or condition will improve Outcome: Progressing Goal: Knowledge of the prescribed therapeutic regimen will improve Outcome: Progressing   Problem: Respiratory: Goal: Levels of oxygenation will improve Outcome: Progressing

## 2021-06-19 NOTE — Hospital Course (Signed)
Michael Ross is a 51 y.o. male history of congestive heart failure, nonischemic cardiomyopathy with 40 mg daily Lasix use, as well as a history of recurrent bronchitis    Acute hypoxic respiratory failure -Likely acute bronchitis, given his outpatient clinical course, suspect underlying asthma versus COPD, check VBG to rule out CO2 retention. -Will treat as COPD/asthma exacerbation, short course of p.o. steroid, azithromycin, breathing treatment with Pulmicort twice daily.  Outpatient PFT. -Baseline procalcitonin -Other DDx, acute CHF decompensation less likely, given his clinical insurance no significant signs of fluid overload.  We will continue home dose of p.o. Lasix.  Outpatient follow-up with [facility]ologist at [facility].   Leukocytosis -No overt signs of pneumonia on x-ray, check atypical infection Legionella and mycoplasma antigen -Azithromycin.   Chronic systolic CHF -To be euvolemic, continue home CHF medications with p.o. Lasix. -UDS   Cigarette smoker -Cessation consultation -Nicotine patch for now.  Acute COPD exacerbation, unclear trigger COVID-19, influenza A, influenza B all negative Quit smoking 3 weeks ago Endorses brief exposure to secondhand smoking Switch prednisone to IV Solu-Medrol 40 mg twice daily Start bronchodilators Received 1 dose of IV Lasix 20 milligrams x1 Continue azithromycin and pulmonary toilet Mobilize as tolerated   Acute hypoxic respiratory failure secondary to above No oxygen supplementation at baseline Currently requiring 1 to 2 L to maintain a saturation greater than 90% Wean off oxygen supplementation as tolerated. Continue management as stated above.   Leukocytosis likely reactive in the setting of steroid use Procalcitonin negative Afebrile, no evidence of pneumonia on chest x-ray. Continue to monitor   Tobacco use disorder Recently quit 3 weeks ago, encouraged to continue to abstain from tobacco use Endorses brief exposure to  secondhand smoking   Morbid obesity BMI 42 Encouraged weight loss with regular physical activity and healthy dieting.

## 2021-06-19 NOTE — Discharge Summary (Signed)
Physician Discharge Summary   Patient: Michael Ross MRN: [id number] DOB: May 04, 1971  Admit date:     06/17/2021  Discharge date: 06/19/21  Discharge Physician: Murray Hodgkins   [facility]   Recommendations at discharge:   Resolution of acute hypoxic respiratory failure, acute bronchitis  Discharge Diagnoses: Principal Problem: Acute hypoxic respiratory failure secondary to acute bronchitis   Active Problems: Chronic systolic CHF Morbid obesity  Hospital Course: 51 year old man PMH chronic systolic CHF, bronchitis, presented with wheezing, admitted for acute hypoxic respiratory failure secondary to acute bronchitis.  No history of known COPD or ongoing asthma.  Treated for acute bronchitis with steroids, antibiotics, supplemental oxygen with gradual clinical improvement, resolution of hypoxia.  On discharge patient will complete antibiotic and steroid course and did not require oxygen.  Acute hypoxic respiratory failure --Secondary to acute bronchitis, complicated by smoking, no known history of COPD or asthma.  Discharged home on antibiotic and prednisone, albuterol inhaler.  Follow-up as an outpatient.  Acute bronchitis   Acute bronchitis --As above.  COVID, influenza negative.   Chronic systolic CHF --Compensated during admission.  Continue chronic medications.   Cigarette smoker --Patient plans to not smoke again..  Morbid obesity --BMI 42 -- Follow-up as an outpatient    \ Consultants: none  Procedures performed: none  Disposition: Home Diet recommendation:  Discharge Diet Orders (From admission, onward)     Start     Ordered   06/19/21 0000  Diet - low sodium heart healthy        06/19/21 1142           Cardiac diet  DISCHARGE MEDICATION: Allergies as of 06/19/2021       Reactions   Lisinopril Swelling   angioedema        Medication List     STOP taking these medications    chlorpheniramine-HYDROcodone 10-8 MG/5ML  [place] Commonly known as: Tussionex Pennkinetic ER   ketorolac 0.5 % ophthalmic solution Commonly known as: Acular   trimethoprim-polymyxin b ophthalmic solution Commonly known as: POLYTRIM       TAKE these medications    albuterol 108 (90 Base) MCG/ACT inhaler Commonly known as: VENTOLIN HFA Inhale 2 puffs into the lungs every 6 (six) hours as needed for wheezing or shortness of breath.   atorvastatin 10 MG tablet Commonly known as: LIPITOR Take 1 tablet (10 mg total) by mouth daily.   azithromycin 500 MG tablet Commonly known as: ZITHROMAX Start 2/5 in AM What changed:  medication strength additional instructions   benzonatate 100 MG capsule Commonly known as: Tessalon Perles Take 1 capsule (100 mg total) by mouth 3 (three) times daily as needed for cough.   furosemide 40 MG tablet Commonly known as: LASIX Take 1 tablet (40 mg total) by mouth 2 (two) times daily. What changed: when to take this   losartan 100 MG tablet Commonly known as: COZAAR Take 100 mg by mouth daily.   metoprolol 200 MG 24 hr tablet Commonly known as: TOPROL-XL Take 200 mg by mouth daily.   nicotine 14 mg/24hr patch Commonly known as: NICODERM CQ - dosed in mg/24 hours Place 1 patch (14 mg total) onto the skin daily.   predniSONE 20 MG tablet Commonly known as: DELTASONE Take 2 tablets (40 mg total) by mouth daily with breakfast for 5 days.   sildenafil 100 MG tablet Commonly known as: VIAGRA Take 100 mg by mouth daily as needed.   spironolactone 25 MG tablet Commonly known as: ALDACTONE Take 25  mg by mouth daily.        Follow-up [place]. Call in 1 week(s).   Why: Facility has requested that the patient call and reach out to the facility to schedule post hospital follow up. Contact information: [place] [address] [phone]                Feels better, breathing better Discharge Exam: Filed Weights   06/17/21  0741  Weight: 116.1 kg   Physical Exam Vitals reviewed.  Constitutional:      General: He is not in acute distress. Cardiovascular:     Rate and Rhythm: Normal rate and regular rhythm.     Heart sounds: No murmur heard. Pulmonary:     Effort: Pulmonary effort is normal. No respiratory distress.     Breath sounds: No wheezing, rhonchi or rales.  Musculoskeletal:     Right lower leg: No edema.     Left lower leg: No edema.  Neurological:     Mental Status: He is alert.  Psychiatric:        Mood and Affect: Mood normal.        Behavior: Behavior normal.     Condition at discharge: good  The results of significant diagnostics from this hospitalization (including imaging, microbiology, ancillary and laboratory) are listed below for reference.   Imaging Studies: DG Chest 2 View  Result Date: 06/17/2021 CLINICAL DATA:  Cough, wheezing, dyspnea EXAM: CHEST - 2 VIEW COMPARISON:  05/18/2021 chest radiograph. FINDINGS: Stable cardiomediastinal silhouette with normal heart size. No pneumothorax. No pleural effusion. No pulmonary edema. No acute consolidative airspace disease. Minimal linear scarring at the left lung base is unchanged. IMPRESSION: No active cardiopulmonary disease. Electronically Signed   By: Ilona Sorrel M.D.   On: 06/17/2021 08:12    Microbiology: Results for orders placed or performed during the hospital encounter of 06/17/21  Resp Panel by RT-PCR (Flu A&B, Covid) Nasopharyngeal Swab     Status: None   Collection Time: 06/17/21 11:48 PM   Specimen: Nasopharyngeal Swab; Nasopharyngeal(NP) swabs in vial transport medium  Result Value Ref Range Status   SARS Coronavirus 2 by RT PCR NEGATIVE NEGATIVE Final    Comment: (NOTE) SARS-CoV-2 target nucleic acids are NOT DETECTED.  The SARS-CoV-2 RNA is generally detectable in upper respiratory specimens during the acute phase of infection. The lowest concentration of SARS-CoV-2 viral copies this assay can detect is 138  copies/mL. A negative result does not preclude SARS-Cov-2 infection and should not be used as the sole basis for treatment or other patient management decisions. A negative result may occur with  improper specimen collection/handling, submission of specimen other than nasopharyngeal swab, presence of viral mutation(s) within the areas targeted by this assay, and inadequate number of viral copies(<138 copies/mL). A negative result must be combined with clinical observations, patient history, and epidemiological information. The expected result is Negative.  Fact Sheet for Patients:  EntrepreneurPulse.com.au  Fact Sheet for Healthcare Providers:  IncredibleEmployment.be  This test is no t yet approved or cleared by the [place] FDA and  has been authorized for detection and/or diagnosis of SARS-CoV-2 by FDA under an Emergency Use Authorization (EUA). This EUA will remain  in effect (meaning this test can be used) for the duration of the COVID-19 declaration under Section 564(b)(1) of the Act, 21 U.S.C.section 360bbb-3(b)(1), unless the authorization is terminated  or revoked sooner.       Influenza A by PCR  NEGATIVE NEGATIVE Final   Influenza B by PCR NEGATIVE NEGATIVE Final    Comment: (NOTE) The Xpert Xpress SARS-CoV-2/FLU/RSV plus assay is intended as an aid in the diagnosis of influenza from Nasopharyngeal swab specimens and should not be used as a sole basis for treatment. Nasal washings and aspirates are unacceptable for Xpert Xpress SARS-CoV-2/FLU/RSV testing.  Fact Sheet for Patients: EntrepreneurPulse.com.au  Fact Sheet for Healthcare Providers: IncredibleEmployment.be  This test is not yet approved or cleared by the [place] FDA and has been authorized for detection and/or diagnosis of SARS-CoV-2 by FDA under an Emergency Use Authorization (EUA). This EUA will remain in effect (meaning  this test can be used) for the duration of the COVID-19 declaration under Section 564(b)(1) of the Act, 21 U.S.C. section 360bbb-3(b)(1), unless the authorization is terminated or revoked.  Performed at [facility], [address] [place], [place] [address]     Labs: CBC: Recent Labs  Lab 06/17/21 0811  WBC 13.0*  HGB 13.9  HCT 44.2  MCV 91.3  PLT 374   Basic Metabolic Panel: Recent Labs  Lab 06/17/21 0811  NA 137  K 3.6  CL 100  CO2 30  GLUCOSE 102*  BUN 15  CREATININE 1.17  CALCIUM 8.9   Liver Function Tests: No results for input(s): AST, ALT, ALKPHOS, BILITOT, PROT, ALBUMIN in the last 168 hours. CBG: No results for input(s): GLUCAP in the last 168 hours.  Discharge time spent: greater than 30 minutes.  Signed: Murray Hodgkins, MD [facility] 06/19/2021

## 2021-06-19 NOTE — Progress Notes (Signed)
SATURATION QUALIFICATIONS: (This note is used to comply with regulatory documentation for home oxygen)  Patient Saturations on Room Air at Rest = 92%  Patient Saturations on Room Air while Ambulating = 95%

## 2021-06-19 NOTE — TOC Transition Note (Signed)
Transition of Care [facility]) - CM/SW Discharge Note   Patient Details  Name: Michael Ross MRN: [id number] Date of Birth: 02-11-1971  [facility] CM/SW Contact:  Izola Price, RN Phone Number: 06/19/2021, 11:53 AM   Clinical Narrative:  2/4: Discharge orders in. Good Rx Card given during progression rounds to RN for patient. No DME oxygen needs per Unit RN ambulation. No other TOC needs identified at this time. Simmie Davies RN CM      Final next level of care: Home/Self Care Barriers to Discharge: Barriers Resolved   Patient Goals and CMS Choice     Choice offered to / list presented to : NA  Discharge Placement                       Discharge Plan and Services                DME Arranged: N/A DME Agency: NA       HH Arranged: NA HH Agency: NA        Social Determinants of Health (SDOH) Interventions     Readmission Risk Interventions No flowsheet data found.

## 2021-06-21 LAB — LEGIONELLA PNEUMOPHILA SEROGP 1 UR AG: L. pneumophila Serogp 1 Ur Ag: NEGATIVE

## 2021-06-22 LAB — MYCOPLASMA PNEUMONIAE ANTIBODY, IGM: Mycoplasma pneumo IgM: <770 U/mL (ref 0–769)

## 2021-07-22 ENCOUNTER — Other Ambulatory Visit: Payer: Self-pay

## 2021-07-22 ENCOUNTER — Emergency Department
Admission: EM | Admit: 2021-07-22 | Discharge: 2021-07-22 | Disposition: A | Discharge status: Home / Self Care | Payer: Self-pay | Attending: Emergency Medicine | Admitting: Emergency Medicine

## 2021-07-22 ENCOUNTER — Emergency Department: Payer: Self-pay

## 2021-07-22 ENCOUNTER — Encounter: Payer: Self-pay | Admitting: Emergency Medicine

## 2021-07-22 DIAGNOSIS — I110 Hypertensive heart disease with heart failure: Secondary | ICD-10-CM | POA: Insufficient documentation

## 2021-07-22 DIAGNOSIS — R0600 Dyspnea, unspecified: Secondary | ICD-10-CM

## 2021-07-22 DIAGNOSIS — I509 Heart failure, unspecified: Secondary | ICD-10-CM | POA: Insufficient documentation

## 2021-07-22 DIAGNOSIS — J45909 Unspecified asthma, uncomplicated: Secondary | ICD-10-CM | POA: Insufficient documentation

## 2021-07-22 LAB — CBC
HCT: 46.4 % (ref 39.0–52.0)
Hemoglobin: 14.1 g/dL (ref 13.0–17.0)
MCH: 28.4 pg (ref 26.0–34.0)
MCHC: 30.4 g/dL (ref 30.0–36.0)
MCV: 93.5 fL (ref 80.0–100.0)
Platelets: 360 10*3/uL (ref 150–400)
RBC: 4.96 MIL/uL (ref 4.22–5.81)
RDW: 13.2 % (ref 11.5–15.5)
WBC: 13.8 10*3/uL — ABNORMAL HIGH (ref 4.0–10.5)
nRBC: 0 % (ref 0.0–0.2)

## 2021-07-22 LAB — BASIC METABOLIC PANEL
Anion gap: 11 (ref 5–15)
BUN: 13 mg/dL (ref 6–20)
CO2: 30 mmol/L (ref 22–32)
Calcium: 9 mg/dL (ref 8.9–10.3)
Chloride: 97 mmol/L — ABNORMAL LOW (ref 98–111)
Creatinine, Ser: 1.27 mg/dL — ABNORMAL HIGH (ref 0.61–1.24)
GFR, Estimated: >60 mL/min (ref >60)
Glucose, Bld: 121 mg/dL — ABNORMAL HIGH (ref 70–99)
Potassium: 3.1 mmol/L — ABNORMAL LOW (ref 3.5–5.1)
Sodium: 138 mmol/L (ref 135–145)

## 2021-07-22 LAB — BRAIN NATRIURETIC PEPTIDE: B Natriuretic Peptide: 35.3 pg/mL (ref 0.0–100.0)

## 2021-07-22 LAB — TROPONIN I (HIGH SENSITIVITY)
Troponin I (High Sensitivity): 9 ng/L (ref <18)
Troponin I (High Sensitivity): 7 ng/L (ref <18)

## 2021-07-22 MED ORDER — PREDNISONE 20 MG PO TABS: 40.0000 mg | ORAL_TABLET | Freq: Every day | ORAL | 0 refills | Status: AC

## 2021-07-22 MED ORDER — IPRATROPIUM-ALBUTEROL 0.5-2.5 (3) MG/3ML IN SOLN
3.0000 mL | Freq: Once | RESPIRATORY_TRACT | Status: AC
  Administered 2021-07-22: 13:00:00 3 mL via RESPIRATORY_TRACT
  Filled 2021-07-22: qty 3

## 2021-07-22 MED ORDER — DEXAMETHASONE SODIUM PHOSPHATE 10 MG/ML IJ SOLN
10.0000 mg | Freq: Once | INTRAMUSCULAR | Status: AC
Start: 2021-07-22 — End: 2021-07-22
  Administered 2021-07-22: 13:00:00 10 mg via INTRAMUSCULAR
  Filled 2021-07-22: qty 1

## 2021-07-22 NOTE — ED Provider Notes (Signed)
? ?[facility] ?Provider Note ? ? ? Event Date/Time  ? First MD Initiated Contact with Patient 07/22/21 1237   ?  (approximate) ? ?History  ? ?Chief Complaint: Chest Pain ? ?HPI ? ?Michael Ross is a 51 y.o. male with a past medical history of CHF/hypertension, presents to the emergency department for shortness of breath.  Patient states over the past 2 days or so he has been experiencing some shortness of breath with exertion as well as wheezing.  Patient has been using his albuterol inhaler without relief.  Patient states slight chest tightness but denies any "pain."  Denies any significant cough or congestion.  No fever.  I reviewed the patient's recent records including recent discharge summary on 06/19/2021 after an admission for shortness of breath likely caused by acute bronchitis as well as CHF. ? ?Physical Exam  ? ?Triage Vital Signs: ?ED Triage Vitals  ?Enc Vitals Group  ?   BP 07/22/21 1108 (!) 143/89  ?   Pulse Rate 07/22/21 1108 79  ?   Resp 07/22/21 1108 20  ?   Temp 07/22/21 1108 98.5 ?F (36.9 ?C)  ?   Temp Source 07/22/21 1108 Oral  ?   SpO2 07/22/21 1108 94 %  ?   Weight 07/22/21 1101 255 lb 15.3 oz (116.1 kg)  ?   Height 07/22/21 1101 '5\' 5"'$  (1.651 m)  ?   Head Circumference --   ?   Peak Flow --   ?   Pain Score 07/22/21 1100 6  ?   Pain Loc --   ?   Pain Edu? --   ?   Excl. in [place]? --   ? ? ?Most recent vital signs: ?Vitals:  ? 07/22/21 1108  ?BP: (!) 143/89  ?Pulse: 79  ?Resp: 20  ?Temp: 98.5 ?F (36.9 ?C)  ?SpO2: 94%  ? ? ?General: Awake, no distress.  ?CV:  Good peripheral perfusion.  Regular rate and rhythm  ?Resp:  Normal effort.  Equal breath sounds bilaterally.  Moderate expiratory wheeze bilaterally. ?Abd:  No distention.  Soft, nontender.  No rebound or guarding.  Obese. ? ?ED Results / Procedures / Treatments  ? ?EKG ? ?EKG viewed and interpreted by myself shows a normal sinus rhythm at 77 bpm with a narrow QRS, normal axis, normal intervals, no concerning ST  changes. ? ?RADIOLOGY ? ?I personally viewed the patient's chest x-ray images, no acute abnormality seen on my evaluation. ?Chest x-ray read as negative by radiology ? ? ?MEDICATIONS ORDERED IN ED: ?Medications  ?dexamethasone (DECADRON) injection 10 mg (has no administration in time range)  ?ipratropium-albuterol (DUONEB) 0.5-2.5 (3) MG/3ML nebulizer solution 3 mL (has no administration in time range)  ?ipratropium-albuterol (DUONEB) 0.5-2.5 (3) MG/3ML nebulizer solution 3 mL (has no administration in time range)  ? ? ? ?IMPRESSION / MDM / ASSESSMENT AND PLAN / ED COURSE  ?I reviewed the triage vital signs and the nursing notes. ? ?Patient presents to the emergency department for shortness of breath and wheeze worsening over the past 2 days.  Patient currently satting 97% on room air during my evaluation.  Does have expiratory wheeze bilaterally.  Chest x-ray appears clear no sign of pulmonary or interstitial edema.  Lab work is so far reassuring including a largely baseline chemistry, slight leukocytosis otherwise normal CBC.  Negative troponin.  Given the patient's bilateral wheezing we will start the patient on DuoNebs, we will dose IM Decadron and continue to closely monitor.  We will recheck  a troponin as a precaution but patient's work-up so far is reassuring.  Continues to sat between 95 and 98% during my evaluation on room air. ? ?Patient's repeat troponin remains negative.  On repeat examination patient has much clear lungs with no wheeze auscultated.  Satting 98% on room air.  Patient states he is feeling well.  BNP is resulted at 35.  We will discharge patient on a 5-day course of prednisone.  Patient states he has plenty of albuterol and will continue to use his inhaler every 4 hours as needed for shortness of breath.  Discussed return precautions.  Patient agreeable to plan. ? ?FINAL CLINICAL IMPRESSION(S) / ED DIAGNOSES  ? ?Dyspnea ?Reactive airway disease ? ?Note:  This document was prepared using  Dragon voice recognition software and may include unintentional dictation errors. ?  ?Harvest Dark, MD ?07/22/21 1447 ? ?

## 2021-07-22 NOTE — ED Triage Notes (Signed)
Pt comes into the ED via POV c/o chest tightness and wheezing.  Pt is a CHF patient with recent admission.  Pt denies any new swelling that he has noticed.  Pt states the symptoms started yesterday while at work.  Pt states the symptoms started with exertion at work.  ?

## 2021-11-04 DIAGNOSIS — D1730 Benign lipomatous neoplasm of skin and subcutaneous tissue of unspecified sites: Secondary | ICD-10-CM | POA: Diagnosis not present

## 2021-11-29 DIAGNOSIS — D487 Neoplasm of uncertain behavior of other specified sites: Secondary | ICD-10-CM | POA: Diagnosis not present

## 2021-12-29 ENCOUNTER — Encounter: Payer: Self-pay | Admitting: Unknown Physician Specialty

## 2022-01-10 NOTE — Anesthesia Preprocedure Evaluation (Addendum)
Anesthesia Evaluation  Patient identified by MRN, date of birth, ID band Patient awake    Reviewed: Allergy & Precautions, NPO status , Patient's Chart, lab work & pertinent test results  Airway Mallampati: III  TM Distance: >3 FB Neck ROM: full    Dental no notable dental hx.    Pulmonary neg COPD, Current Smoker and Patient abstained from smoking.,  Reactive airway disease with emergency room visit 07/2021   Pulmonary exam normal        Cardiovascular Exercise Tolerance: Good hypertension, Pt. on medications and Pt. 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 Recent LVEF 55% on 12/2019)  Normal cardiovascular exam  Summary  1. The left ventricle is normal in size with normal wall thickness.  2. The left ventricular systolic function is normal, LVEF is visually  estimated at 55%.  3. The left atrium is mildly dilated in size.  4. The right ventricle is normal in size, with normal systolic function.     Neuro/Psych negative neurological ROS  negative psych ROS   GI/Hepatic negative GI ROS, Neg liver ROS,   Endo/Other  Morbid obesity  Renal/GU negative Renal ROS  negative genitourinary   Musculoskeletal   Abdominal (+) + obese,   Peds  Hematology negative hematology ROS (+)   Anesthesia Other Findings Pt was [facility] by [facility] that he couldn't be put to sleep because of his CHF   Past Medical History: No date: CHF (congestive heart failure) (HCC) No date: Complication of anesthesia     Comment:  Pt was told by [facility] that he couldn't be put to sleep               because of his CHF No date: Hypertension  Past Surgical History: 04/13/2016: CARDIAC CATHETERIZATION  BMI    Body Mass Index: 42.43 kg/m      Reproductive/Obstetrics negative OB ROS                            Anesthesia Physical Anesthesia Plan  ASA: 3  Anesthesia Plan: General    Post-op Pain Management: Regional block and Minimal or no pain anticipated   Induction: Intravenous  PONV Risk Score and Plan: Ondansetron and Dexamethasone  Airway Management Planned: LMA  Additional Equipment:   Intra-op Plan:   Post-operative Plan: Extubation in OR  Informed Consent: I have reviewed the patients History and Physical, chart, labs and discussed the procedure including the risks, benefits and alternatives for the proposed anesthesia with the patient or authorized representative who has indicated his/her understanding and acceptance.     Dental advisory given  Plan Discussed with: Anesthesiologist, CRNA and Surgeon  Anesthesia Plan Comments:        Anesthesia Quick Evaluation

## 2022-02-01 ENCOUNTER — Encounter: Payer: Self-pay | Admitting: Unknown Physician Specialty

## 2022-02-04 ENCOUNTER — Encounter: Payer: Self-pay | Admitting: Unknown Physician Specialty

## 2022-02-04 ENCOUNTER — Ambulatory Visit
Admission: RE | Admit: 2022-02-04 | Discharge: 2022-02-04 | Disposition: A | Discharge status: Home / Self Care | Payer: 59 | Attending: Unknown Physician Specialty | Admitting: Unknown Physician Specialty

## 2022-02-04 ENCOUNTER — Ambulatory Visit (AMBULATORY_SURGERY_CENTER): Payer: 59 | Admitting: Anesthesiology

## 2022-02-04 ENCOUNTER — Other Ambulatory Visit: Payer: Self-pay

## 2022-02-04 ENCOUNTER — Other Ambulatory Visit
Admission: RE | Admit: 2022-02-04 | Discharge: 2022-02-04 | Disposition: A | Discharge status: Home / Self Care | Payer: 59 | Attending: Anesthesiology | Admitting: Anesthesiology

## 2022-02-04 ENCOUNTER — Ambulatory Visit
Admission: RE | Disposition: A | Discharge status: Home / Self Care | Payer: Self-pay | Source: Home / Self Care | Attending: Unknown Physician Specialty

## 2022-02-04 ENCOUNTER — Ambulatory Visit: Payer: 59 | Admitting: Anesthesiology

## 2022-02-04 DIAGNOSIS — D170 Benign lipomatous neoplasm of skin and subcutaneous tissue of head, face and neck: Secondary | ICD-10-CM

## 2022-02-04 DIAGNOSIS — I428 Other cardiomyopathies: Secondary | ICD-10-CM

## 2022-02-04 DIAGNOSIS — I110 Hypertensive heart disease with heart failure: Secondary | ICD-10-CM

## 2022-02-04 DIAGNOSIS — Z79899 Other long term (current) drug therapy: Secondary | ICD-10-CM | POA: Insufficient documentation

## 2022-02-04 DIAGNOSIS — F17200 Nicotine dependence, unspecified, uncomplicated: Secondary | ICD-10-CM | POA: Diagnosis not present

## 2022-02-04 DIAGNOSIS — I5032 Chronic diastolic (congestive) heart failure: Secondary | ICD-10-CM | POA: Diagnosis not present

## 2022-02-04 DIAGNOSIS — Z01818 Encounter for other preprocedural examination: Secondary | ICD-10-CM

## 2022-02-04 DIAGNOSIS — I509 Heart failure, unspecified: Secondary | ICD-10-CM | POA: Diagnosis not present

## 2022-02-04 DIAGNOSIS — F17210 Nicotine dependence, cigarettes, uncomplicated: Secondary | ICD-10-CM

## 2022-02-04 DIAGNOSIS — Z6841 Body Mass Index (BMI) 40.0 and over, adult: Secondary | ICD-10-CM | POA: Insufficient documentation

## 2022-02-04 DIAGNOSIS — R69 Illness, unspecified: Secondary | ICD-10-CM | POA: Diagnosis not present

## 2022-02-04 HISTORY — PX: CONTINUOUS NERVE MONITORING: SHX6650

## 2022-02-04 HISTORY — DX: Other complications of anesthesia, initial encounter: T88.59XA

## 2022-02-04 HISTORY — PX: LIPOMA EXCISION: SHX5283

## 2022-02-04 LAB — POTASSIUM: Potassium: 3.5 mmol/L (ref 3.5–5.1)

## 2022-02-04 SURGERY — EXCISION LIPOMA
Anesthesia: General | Laterality: Left

## 2022-02-04 MED ORDER — MIDAZOLAM HCL 5 MG/5ML IJ SOLN
INTRAMUSCULAR | Status: DC | PRN
  Administered 2022-02-04: 2 mg via INTRAVENOUS

## 2022-02-04 MED ORDER — LIDOCAINE HCL (CARDIAC) PF 100 MG/5ML IV SOSY
PREFILLED_SYRINGE | INTRAVENOUS | Status: DC | PRN
  Administered 2022-02-04: 50 mg via INTRATRACHEAL

## 2022-02-04 MED ORDER — EPHEDRINE SULFATE (PRESSORS) 50 MG/ML IJ SOLN
INTRAMUSCULAR | Status: DC | PRN
  Administered 2022-02-04 (×3): 10 mg via INTRAVENOUS

## 2022-02-04 MED ORDER — DEXAMETHASONE SODIUM PHOSPHATE 4 MG/ML IJ SOLN
INTRAMUSCULAR | Status: DC | PRN
  Administered 2022-02-04: 8 mg via INTRAVENOUS

## 2022-02-04 MED ORDER — LIDOCAINE-EPINEPHRINE 2 %-1:100000 IJ SOLN
INTRAMUSCULAR | Status: DC | PRN
  Administered 2022-02-04: 3 mL via INTRADERMAL

## 2022-02-04 MED ORDER — HYDROCODONE-ACETAMINOPHEN 5-300 MG PO TABS: 1.0000 | ORAL_TABLET | ORAL | 0 refills | Status: DC | PRN

## 2022-02-04 MED ORDER — ONDANSETRON HCL 4 MG/2ML IJ SOLN
INTRAMUSCULAR | Status: DC | PRN
  Administered 2022-02-04: 4 mg via INTRAVENOUS

## 2022-02-04 MED ORDER — GLYCOPYRROLATE 0.2 MG/ML IJ SOLN
INTRAMUSCULAR | Status: DC | PRN
  Administered 2022-02-04: .2 mg via INTRAVENOUS

## 2022-02-04 MED ORDER — LACTATED RINGERS IV SOLN: INTRAVENOUS | Status: DC

## 2022-02-04 MED ORDER — BACITRACIN 500 UNIT/GM EX OINT
TOPICAL_OINTMENT | CUTANEOUS | Status: DC | PRN
  Administered 2022-02-04: 1 via TOPICAL

## 2022-02-04 MED ORDER — PROPOFOL 10 MG/ML IV BOLUS
INTRAVENOUS | Status: DC | PRN
  Administered 2022-02-04: 150 mg via INTRAVENOUS

## 2022-02-04 MED ORDER — FENTANYL CITRATE (PF) 100 MCG/2ML IJ SOLN
INTRAMUSCULAR | Status: DC | PRN
  Administered 2022-02-04 (×2): 50 ug via INTRAVENOUS

## 2022-02-04 SURGICAL SUPPLY — 38 items
"PENCIL ELECTRO HAND CTR " (MISCELLANEOUS) ×1 IMPLANT
APPLICATOR COTTON TIP WD 3 STR (MISCELLANEOUS) IMPLANT
BLADE SURG 15 STRL LF DISP TIS (BLADE) IMPLANT
BLADE SURG 15 STRL SS (BLADE)
CANISTER SUCT 1200ML W/VALVE (MISCELLANEOUS) IMPLANT
CORD BIP STRL DISP 12FT (MISCELLANEOUS) IMPLANT
DRAPE HEAD BAR (DRAPES) IMPLANT
DRSG TELFA 4X3 1S NADH ST (GAUZE/BANDAGES/DRESSINGS) IMPLANT
ELECT CAUTERY BLADE TIP 2.5 (TIP)
ELECT CAUTERY NDL 2.0 MIC (NEEDLE) IMPLANT
ELECT CAUTERY NEEDLE 2.0 MIC (NEEDLE) IMPLANT
ELECT EMG 20MM DUAL (MISCELLANEOUS) ×1
ELECT REM PT RETURN 9FT ADLT (ELECTROSURGICAL)
ELECTRODE CAUTERY BLDE TIP 2.5 (TIP) IMPLANT
ELECTRODE EMG 20MM DUAL (MISCELLANEOUS) IMPLANT
ELECTRODE REM PT RTRN 9FT ADLT (ELECTROSURGICAL) ×1 IMPLANT
GAUZE SPONGE 4X4 12PLY STRL (GAUZE/BANDAGES/DRESSINGS) IMPLANT
GLOVE SURG ENC TEXT LTX SZ7.5 (GLOVE) ×2 IMPLANT
KIT TURNOVER KIT A (KITS) ×1 IMPLANT
LIQUID BAND (GAUZE/BANDAGES/DRESSINGS) IMPLANT
NDL HYPO 25GX1X1/2 BEV (NEEDLE) ×1 IMPLANT
NEEDLE HYPO 25GX1X1/2 BEV (NEEDLE) ×1 IMPLANT
NS IRRIG 500ML POUR BTL (IV SOLUTION) ×1 IMPLANT
PACK ENT CUSTOM (PACKS) ×1 IMPLANT
PENCIL ELECTRO HAND CTR (MISCELLANEOUS) ×1 IMPLANT
SOL PREP PVP 2OZ (MISCELLANEOUS)
SOLUTION PREP PVP 2OZ (MISCELLANEOUS) IMPLANT
STRAP BODY AND KNEE 60X3 (MISCELLANEOUS) ×1 IMPLANT
SUCTION FRAZIER HANDLE 10FR (MISCELLANEOUS)
SUCTION TUBE FRAZIER 10FR DISP (MISCELLANEOUS) IMPLANT
SUT PROLENE 5 0 P 3 (SUTURE) IMPLANT
SUT SILK 3 0 (SUTURE) ×1
SUT SILK 3-0 18XBRD TIE 12 (SUTURE) IMPLANT
SUT VIC AB 4-0 RB1 27 (SUTURE)
SUT VIC AB 4-0 RB1 27X BRD (SUTURE) IMPLANT
SUT VIC AB 5-0 PC1 18 (SUTURE) IMPLANT
SYR 10ML LL (SYRINGE) ×1 IMPLANT
TOWEL OR 17X26 4PK STRL BLUE (TOWEL DISPOSABLE) ×1 IMPLANT

## 2022-02-04 NOTE — Anesthesia Postprocedure Evaluation (Signed)
Anesthesia Post Note  Patient: [employer]  Procedure(s) Performed: EXCISION TEMPORAL LIPOMA WITH COMPLEX CLOSURE (Left) FACIAL NERVE MONITORING (Left)     Patient location during evaluation: PACU Anesthesia Type: General Level of consciousness: awake and alert Pain management: pain level controlled Vital Signs Assessment: post-procedure vital signs reviewed and stable Respiratory status: spontaneous breathing, nonlabored ventilation and respiratory function stable Cardiovascular status: blood pressure returned to baseline and stable Postop Assessment: no apparent nausea or vomiting Anesthetic complications: no   No notable events documented.  [place] Ouch

## 2022-02-04 NOTE — H&P (Signed)
The patient's history has been reviewed, patient examined, no change in status, stable for surgery.  Questions were answered to the patients satisfaction.  

## 2022-02-04 NOTE — Transfer of Care (Signed)
Immediate Anesthesia Transfer of Care Note  Patient: [employer]  Procedure(s) Performed: EXCISION TEMPORAL LIPOMA WITH COMPLEX CLOSURE (Left) FACIAL NERVE MONITORING (Left)  Patient Location: PACU  Anesthesia Type: General  Level of Consciousness: awake, alert  and patient cooperative  Airway and Oxygen Therapy: Patient Spontanous Breathing and Patient connected to supplemental oxygen  Post-op Assessment: Post-op Vital signs reviewed, Patient's Cardiovascular Status Stable, Respiratory Function Stable, Patent Airway and No signs of Nausea or vomiting  Post-op Vital Signs: Reviewed and stable  Complications: No notable events documented.

## 2022-02-04 NOTE — Op Note (Signed)
02/04/2022  11:02 AM    Michael Ross  [id number]   Pre-Op Dx: Temporal Lipoma  Post-op Dx: SAME  Proc: Excision of deep temporal scalp lipoma with complex closure; facial nerve monitoring 1 hour  Surg:  Roena Malady  Anes:  GOT  EBL: Less than 5 cc  Comp: None  Findings: 2-1/2 x 3-1/2 cm lipoma deep to the temporalis muscle in the left temporal area  Procedure: [name] was identified in the holding area take the operating placed in supine position.  After laryngeal mask anesthesia the left temporal area was examined.  There was a large lipomatous mass left temporal region.  Facial nerve monitor was applied to the orbicularis oculi muscle was left on throughout the procedure.  The incision line was marked overlying the mass local anesthetic of 1% lidocaine with 1 to 200,000 units of epinephrine was used to inject along this incision line a total of 3 cc was used.  The left temporal face and scalp were prepped and draped sterilely.  A 15 blade was used to incise down to and through the subcutaneous tissues.  The bipolar cautery was used for hemostasis.  The temporalis muscle was identified and gently divided after stimulation event injury to the upper branch of the facial nerve.  With the muscle divided the temporal artery was identified.  This ran right over the surface of the lipoma.  Therefore this was divided between hemostats, and suture ligated using 3-0 silk suture.  The lipoma was then easily identified and circumferentially dissected down to the pericranium.  There were several small vessels were divided using the harmonic scalpel.  With a lipoma removed in its entirety was copiously irrigated with saline.  There is no active bleeding.  Th temporalis also was closed using interrupted 5-0 Vicryl subcutaneous tissues were closed using 5-0 Vicryl and the skin was closed with a 5-0 running Prolene.  With this completed the patient was then returned to anesthesia where he was  awakened in the operating room and taken cart in stable condition.  Prior to awakening the facial nerve monitor was removed.  Specimen: Left temporal lipoma  Dispo:   Good  Plan: Discharged home follow-up 10 days for suture removal routine wound care  Roena Malady  02/04/2022 11:02 AM

## 2022-02-07 LAB — SURGICAL PATHOLOGY

## 2022-05-06 ENCOUNTER — Emergency Department: Payer: 59

## 2022-05-06 ENCOUNTER — Emergency Department
Admission: EM | Admit: 2022-05-06 | Discharge: 2022-05-06 | Disposition: A | Discharge status: Home / Self Care | Payer: 59 | Attending: Emergency Medicine | Admitting: Emergency Medicine

## 2022-05-06 DIAGNOSIS — I509 Heart failure, unspecified: Secondary | ICD-10-CM | POA: Diagnosis not present

## 2022-05-06 DIAGNOSIS — I110 Hypertensive heart disease with heart failure: Secondary | ICD-10-CM | POA: Insufficient documentation

## 2022-05-06 DIAGNOSIS — Z1152 Encounter for screening for COVID-19: Secondary | ICD-10-CM | POA: Insufficient documentation

## 2022-05-06 DIAGNOSIS — J209 Acute bronchitis, unspecified: Secondary | ICD-10-CM | POA: Insufficient documentation

## 2022-05-06 DIAGNOSIS — R059 Cough, unspecified: Secondary | ICD-10-CM | POA: Diagnosis not present

## 2022-05-06 LAB — RESP PANEL BY RT-PCR (RSV, FLU A&B, COVID)  RVPGX2
Influenza A by PCR: NEGATIVE
Influenza B by PCR: NEGATIVE
Resp Syncytial Virus by PCR: NEGATIVE
SARS Coronavirus 2 by RT PCR: NEGATIVE

## 2022-05-06 MED ORDER — IPRATROPIUM BROMIDE HFA 17 MCG/ACT IN AERS: 1.0000 | INHALATION_SPRAY | Freq: Three times a day (TID) | RESPIRATORY_TRACT | 2 refills | Status: DC

## 2022-05-06 MED ORDER — PREDNISONE 10 MG (21) PO TBPK: ORAL_TABLET | ORAL | 0 refills | Status: DC

## 2022-05-06 MED ORDER — ALBUTEROL SULFATE HFA 108 (90 BASE) MCG/ACT IN AERS: 2.0000 | INHALATION_SPRAY | Freq: Four times a day (QID) | RESPIRATORY_TRACT | 0 refills | Status: DC | PRN

## 2022-05-06 NOTE — ED Provider Notes (Signed)
[facility] Provider Note  Patient Contact: 6:50 PM (approximate)   History   Cough   HPI  Michael Ross is a 51 y.o. male with a history of hypertension and CHF, presents to the emergency department with cough for the past 4 days.  Patient reports that he had a viral illness approximately 7 days ago which seem to have completely resolved.  Noticed over the past 1 to 2 days and became concerned.  He denies lower extremity swelling or exertional shortness of breath.  No chest tightness or chest pain.  No nausea, vomiting or abdominal pain.      Physical Exam   Triage Vital Signs: ED Triage Vitals  Enc Vitals Group     BP 05/06/22 1726 132/76     Pulse Rate 05/06/22 1726 66     Resp 05/06/22 1726 18     Temp 05/06/22 1726 98.4 F (36.9 C)     Temp Source 05/06/22 1726 Oral     SpO2 05/06/22 1726 96 %     Weight 05/06/22 1727 255 lb (115.7 kg)     Height 05/06/22 1727 '5\' 5"'$  (1.651 m)     Head Circumference --      Peak Flow --      Pain Score 05/06/22 1725 7     Pain Loc --      Pain Edu? --      Excl. in [place]? --     Most recent vital signs: Vitals:   05/06/22 1726  BP: 132/76  Pulse: 66  Resp: 18  Temp: 98.4 F (36.9 C)  SpO2: 96%     General: Alert and in no acute distress. Eyes:  PERRL. EOMI. Head: No acute traumatic findings ENT:      Nose: No congestion/rhinnorhea.      Mouth/Throat: Mucous membranes are moist. Neck: No stridor. No cervical spine tenderness to palpation. Cardiovascular:  Good peripheral perfusion Respiratory: Normal respiratory effort without tachypnea or retractions. Lungs CTAB. Good air entry to the bases with no decreased or absent breath sounds. Gastrointestinal: Bowel sounds 4 quadrants. Soft and nontender to palpation. No guarding or rigidity. No palpable masses. No distention. No CVA tenderness. Musculoskeletal: Full range of motion to all extremities.  Neurologic:  No gross focal neurologic deficits  are appreciated.  Skin:   No rash noted Other:   ED Results / Procedures / Treatments   Labs (all labs ordered are listed, but only abnormal results are displayed) Labs Reviewed  RESP PANEL BY RT-PCR (RSV, FLU A&B, COVID)  [id number]       RADIOLOGY  I personally viewed and evaluated these images as part of my medical decision making, as well as reviewing the written report by the radiologist.  ED Provider Interpretation: Chest x-ray unremarkable   PROCEDURES:  Critical Care performed: No  Procedures   MEDICATIONS ORDERED IN ED: Medications - No data to display   IMPRESSION / MDM / [place] / ED COURSE  I reviewed the triage vital signs and the nursing notes.                              Assessment and plan Cough 51 year old male presents to the emergency department with cough for the past 4 days.  Vital signs are reassuring in triage.  On exam, patient was alert, active and nontoxic-appearing.  He ambulated easily from the waiting room to the triage extension without  perceived breathlessness.  Patient was maintaining his O2 sats at 96% on room air.  He did have some audible wheezing in the lung bases bilaterally but no increased work of breathing.  His chest x-ray was unremarkable.  Patient had no pitting edema of the lower extremities.  I did discuss transitioning patient to flex/main for further workup including labs the patient stated that he would like to try conservative management at home first.  Given reassuring vital signs and physical exam, I feel that this is reasonable at this time.  Will trial patient on prednisone and albuterol and Atrovent at home.  Recommended continuing patient's daily Lasix, 40 mg twice daily as directed.  Patient was cautioned to return to the emergency department within 2 days if symptoms seem to be worsening at home.  He voiced understanding and has Easy Access to the emergency department.     FINAL CLINICAL IMPRESSION(S)  / ED DIAGNOSES   Final diagnoses:  Acute bronchitis, unspecified organism     Rx / DC Orders   ED Discharge Orders          Ordered    predniSONE (STERAPRED UNI-PAK 21 TAB) 10 MG (21) TBPK tablet        05/06/22 1840    ipratropium (ATROVENT HFA) 17 MCG/ACT inhaler  3 times daily        05/06/22 1840    albuterol (VENTOLIN HFA) 108 (90 Base) MCG/ACT inhaler  Every 6 hours PRN       Note to Pharmacy: Please supply with spacer   05/06/22 1841             Note:  This document was prepared using Dragon voice recognition software and may include unintentional dictation errors.   Vallarie Mare Harrison, PA-C 05/06/22 1854    Nance Pear, MD 05/06/22 2034

## 2022-05-06 NOTE — Discharge Instructions (Signed)
You have been prescribed an albuterol inhaler.  You can take 2 puffs every 4 hours. You have also been prescribed an Atrovent inhaler.  You can use 1 puff 3 times daily. Start prednisone taper for bronchitis. If you develop worsening shortness of breath, leg swelling or crackles, please return to the emergency department

## 2022-05-06 NOTE — ED Triage Notes (Signed)
Pt presents tot he ED via POV due to "wheezing" and cough since Sunday. Pt states he has a hx of CHF. Pt c/o congestion. Pt denies NVD. Pt A&Ox4

## 2022-05-11 ENCOUNTER — Telehealth: Payer: Self-pay

## 2022-05-11 NOTE — Patient Outreach (Signed)
  Care Coordination TOC Note Transition Care Management Follow-up Telephone Call Date of discharge and from where: 05/06/22-[facility] ED  DX: "acute bronchitis" How have you been since you were released from the hospital? Patient states he is doing okay. He is still having some wheezing at times-taking Albuterol. He finished up Prednisone dosage today.  Any questions or concerns? No  Items Reviewed: Did the pt receive and understand the discharge instructions provided? Yes  Medications obtained and verified? Yes  Other? Yes  Any new allergies since your discharge? No  Dietary orders reviewed? Yes Do you have support at home? Yes   Home Care and Equipment/Supplies: Were home health services ordered? not applicable If so, what is the name of the agency? N/A  Has the agency set up a time to come to the patient's home? not applicable Were any new equipment or medical supplies ordered?  No What is the name of the medical supply agency? N/A Were you able to get the supplies/equipment? not applicable Do you have any questions related to the use of the equipment or supplies? No  Functional Questionnaire: (I = Independent and D = Dependent) ADLs: I  Bathing/Dressing- I  Meal Prep- I  Eating- I  Maintaining continence- I  Transferring/Ambulation- I  Managing Meds- I  Follow up appointments reviewed:  PCP Hospital f/u appt confirmed?  Patient does not have a PCP-not interested in finding one . [facility] f/u appt confirmed? Yes  Scheduled to see cardiologist-pt. Unable to recall date but voices its "soon." Are transportation arrangements needed? No  If their condition worsens, is the pt aware to call PCP or go to the Emergency Dept.? Yes Was the patient provided with contact information for the PCP's office or ED? Yes Was to pt encouraged to call back with questions or concerns? Yes  SDOH assessments and interventions completed:   Yes SDOH Interventions Today    Flowsheet  Row Most Recent Value  SDOH Interventions   Food Insecurity Interventions Intervention Not Indicated  Transportation Interventions Intervention Not Indicated       Care Coordination Interventions:  Education provided    Encounter Outcome:  Pt. Visit Completed   Enzo Montgomery, RN,BSN,CCM [place] [facility] Telephonic Care Management Coordinator Direct Phone: [phone] Toll Free: [phone] Fax: [phone]

## 2022-05-18 IMAGING — CR DG CHEST 2V
1 series · 2 of 2 positions shown · non-contrast
Comparison: 06/21/2019

CLINICAL DATA: Shortness of breath over the past 1.5 weeks

EXAM:
CHEST - 2 VIEW

[Series 1: dg chest 2 view · 0.14mm/px · 2 of 2 slices shown]
[im 1/2]
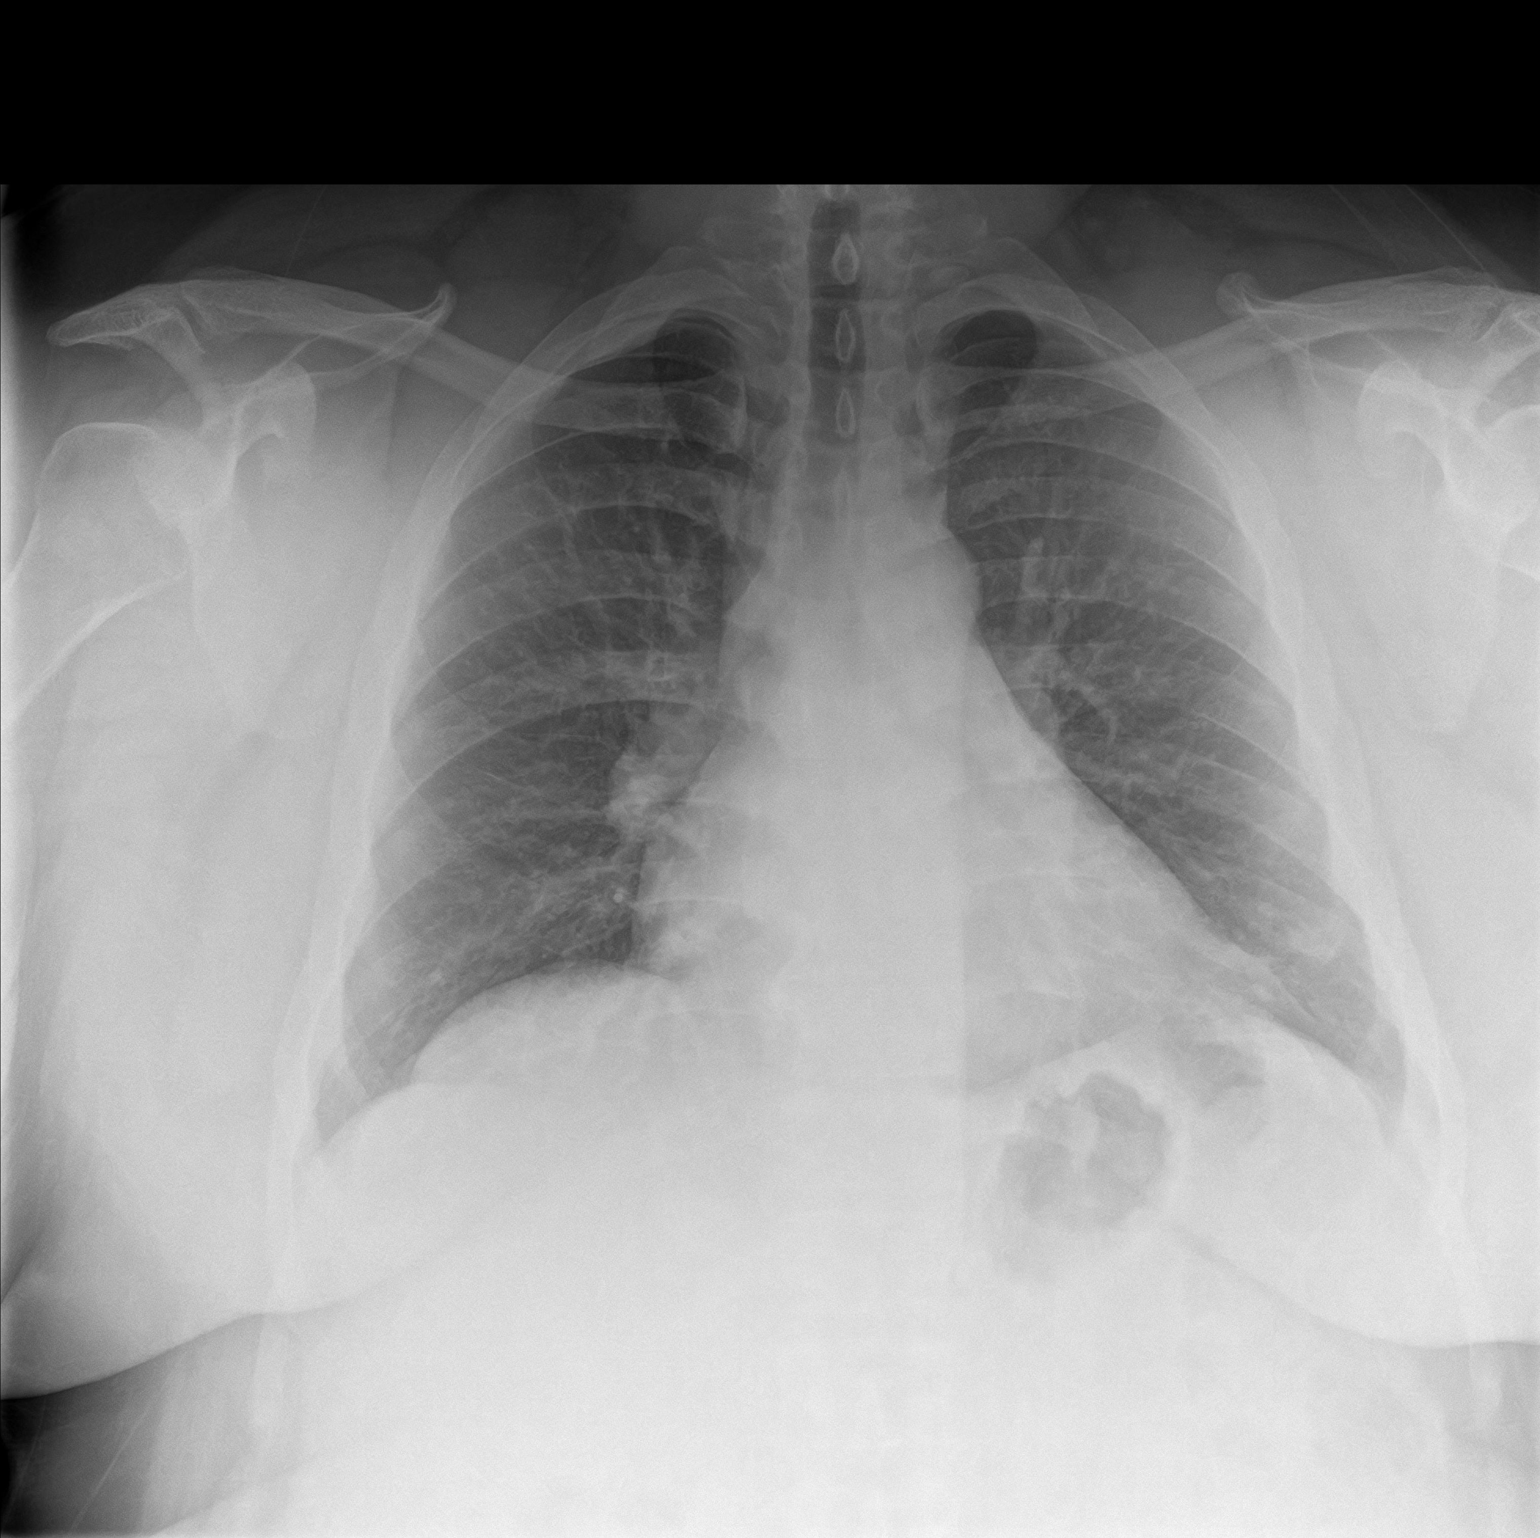
[im 2/2]
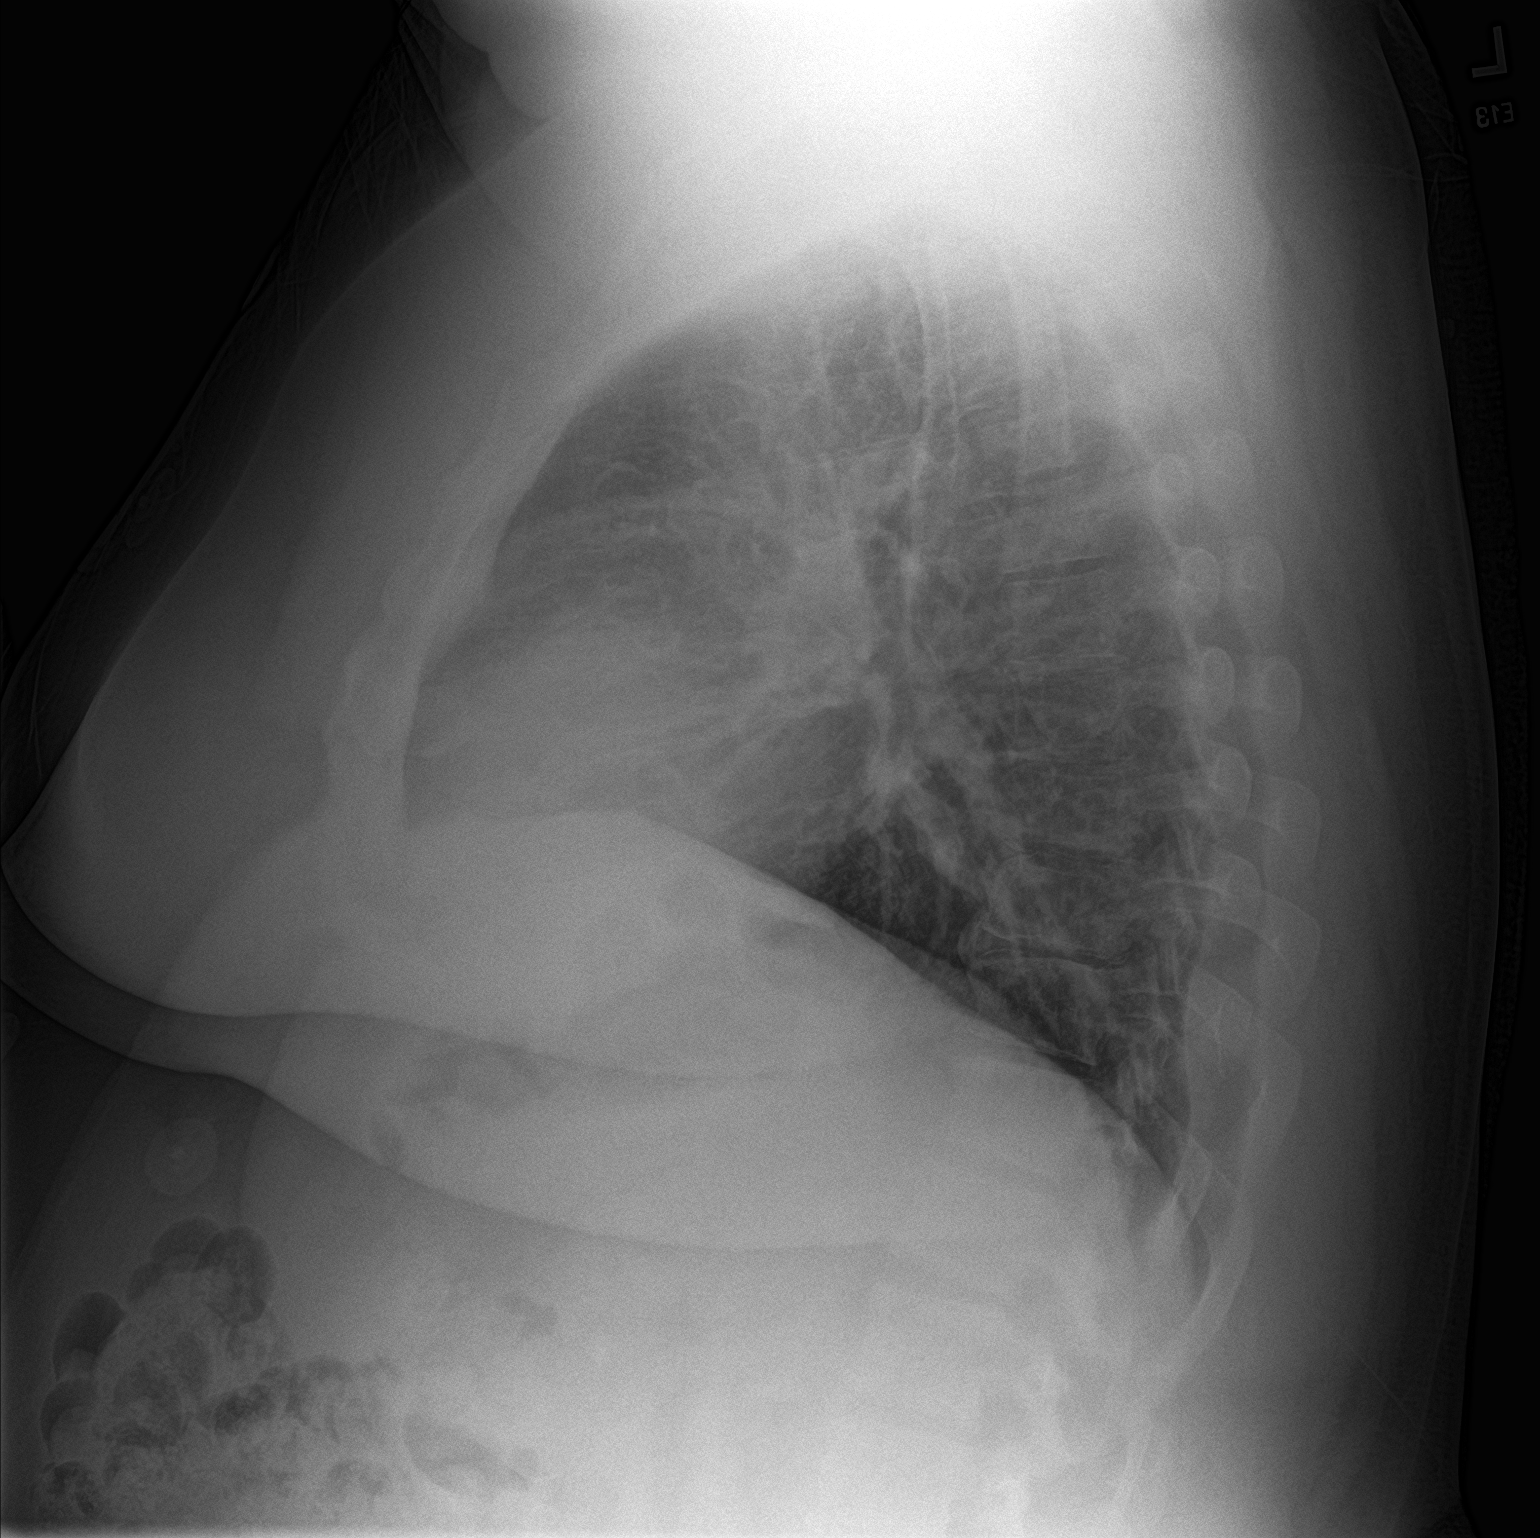

[2 of 2 positions shown; findings below may reference images not displayed]

FINDINGS: Thoracic spondylosis. Ill-defined density projecting over the left
ninth rib posterolaterally, possibly from scarring but a true nodule
is difficult to exclude. This measures about 1.6 by 1.0 cm.

No blunting of the costophrenic angles. Airway thickening is
present, suggesting bronchitis or reactive airways disease. Upper
normal heart size.
IMPRESSION: 1. Airway thickening is present, suggesting bronchitis or reactive
airways disease.
2. Ill-defined density projecting over the left ninth rib
posterolaterally, possibly from scarring but a true nodule is
difficult to exclude. Consider chest CT for further
characterization.

## 2022-05-21 ENCOUNTER — Emergency Department: Payer: 59

## 2022-05-21 ENCOUNTER — Other Ambulatory Visit: Payer: Self-pay

## 2022-05-21 ENCOUNTER — Emergency Department
Admission: EM | Admit: 2022-05-21 | Discharge: 2022-05-21 | Disposition: A | Discharge status: Home / Self Care | Payer: 59 | Attending: Student in an Organized Health Care Education/Training Program | Admitting: Student in an Organized Health Care Education/Training Program

## 2022-05-21 DIAGNOSIS — R051 Acute cough: Secondary | ICD-10-CM

## 2022-05-21 DIAGNOSIS — R059 Cough, unspecified: Secondary | ICD-10-CM | POA: Diagnosis not present

## 2022-05-21 DIAGNOSIS — I509 Heart failure, unspecified: Secondary | ICD-10-CM | POA: Diagnosis not present

## 2022-05-21 DIAGNOSIS — J40 Bronchitis, not specified as acute or chronic: Secondary | ICD-10-CM

## 2022-05-21 DIAGNOSIS — R0602 Shortness of breath: Secondary | ICD-10-CM | POA: Diagnosis not present

## 2022-05-21 LAB — CBC
HCT: 45 % (ref 39.0–52.0)
Hemoglobin: 14 g/dL (ref 13.0–17.0)
MCH: 28.7 pg (ref 26.0–34.0)
MCHC: 31.1 g/dL (ref 30.0–36.0)
MCV: 92.4 fL (ref 80.0–100.0)
Platelets: 304 10*3/uL (ref 150–400)
RBC: 4.87 MIL/uL (ref 4.22–5.81)
RDW: 13.1 % (ref 11.5–15.5)
WBC: 14.6 10*3/uL — ABNORMAL HIGH (ref 4.0–10.5)
nRBC: 0 % (ref 0.0–0.2)

## 2022-05-21 LAB — COMPREHENSIVE METABOLIC PANEL
ALT: 21 U/L (ref 0–44)
AST: 19 U/L (ref 15–41)
Albumin: 3.5 g/dL (ref 3.5–5.0)
Alkaline Phosphatase: 89 U/L (ref 38–126)
Anion gap: 10 (ref 5–15)
BUN: 13 mg/dL (ref 6–20)
CO2: 26 mmol/L (ref 22–32)
Calcium: 9 mg/dL (ref 8.9–10.3)
Chloride: 103 mmol/L (ref 98–111)
Creatinine, Ser: 1.31 mg/dL — ABNORMAL HIGH (ref 0.61–1.24)
GFR, Estimated: >60 mL/min (ref >60)
Glucose, Bld: 104 mg/dL — ABNORMAL HIGH (ref 70–99)
Potassium: 3.7 mmol/L (ref 3.5–5.1)
Sodium: 139 mmol/L (ref 135–145)
Total Bilirubin: 0.6 mg/dL (ref 0.3–1.2)
Total Protein: 7.9 g/dL (ref 6.5–8.1)

## 2022-05-21 LAB — BRAIN NATRIURETIC PEPTIDE: B Natriuretic Peptide: 91.2 pg/mL (ref 0.0–100.0)

## 2022-05-21 MED ORDER — AEROCHAMBER MV MISC: 0 refills | Status: DC

## 2022-05-21 MED ORDER — IPRATROPIUM-ALBUTEROL 0.5-2.5 (3) MG/3ML IN SOLN
3.0000 mL | Freq: Once | RESPIRATORY_TRACT | Status: AC
  Administered 2022-05-21: 3 mL via RESPIRATORY_TRACT
  Filled 2022-05-21: qty 3

## 2022-05-21 MED ORDER — PREDNISONE 20 MG PO TABS
60.0000 mg | ORAL_TABLET | Freq: Once | ORAL | Status: AC
  Administered 2022-05-21: 60 mg via ORAL
  Filled 2022-05-21: qty 3

## 2022-05-21 MED ORDER — PREDNISONE 10 MG PO TABS: 10.0000 mg | ORAL_TABLET | Freq: Every day | ORAL | 0 refills | Status: DC

## 2022-05-21 MED ORDER — AZITHROMYCIN 500 MG PO TABS
500.0000 mg | ORAL_TABLET | Freq: Once | ORAL | Status: AC
  Administered 2022-05-21: 500 mg via ORAL
  Filled 2022-05-21: qty 1

## 2022-05-21 MED ORDER — AZITHROMYCIN 250 MG PO TABS: ORAL_TABLET | ORAL | 0 refills | Status: DC

## 2022-05-21 NOTE — ED Triage Notes (Signed)
Pt states he has had a cough and wheezing- pt states he was seen here 2 weeks ago for same and seen by his cardiologist on Wednesday- pt states that he feels like it has gotten worse- pt has a hx of CHF but does not feel like he is retaining fluid

## 2022-05-21 NOTE — ED Notes (Signed)
93% was the lowest o2 sat while ambulating. NAD.

## 2022-05-21 NOTE — ED Provider Notes (Signed)
[facility] Provider Note    Event Date/Time   First MD Initiated Contact with Patient 05/21/22 1710     (approximate)   History   Cough   HPI  Michael Ross is a 52 y.o. male history of CHF presents to the ER for evaluation of cough and wheezing for the past several weeks.  Was recently treated for bronchitis with inhaler as well as oral steroids and did not feel much improvement.  Has followed up with cardiology told that he did not have significant fluid retention.  He comes back to the ER today because he is having frequent coughs to the point where he feels like he is about to vomit.  He is occasionally having some productive cough.  Recently tested negative for COVID flu and RSV.     Physical Exam   Triage Vital Signs: ED Triage Vitals  Enc Vitals Group     BP 05/21/22 1658 (!) 122/108     Pulse Rate 05/21/22 1658 71     Resp 05/21/22 1658 (!) 22     Temp 05/21/22 1658 98.7 F (37.1 C)     Temp Source 05/21/22 1658 Oral     SpO2 05/21/22 1658 93 %     Weight 05/21/22 1700 270 lb (122.5 kg)     Height 05/21/22 1700 '5\' 5"'$  (1.651 m)     Head Circumference --      Peak Flow --      Pain Score 05/21/22 1659 5     Pain Loc --      Pain Edu? --      Excl. in [place]? --     Most recent vital signs: Vitals:   05/21/22 1658  BP: (!) 122/108  Pulse: 71  Resp: (!) 22  Temp: 98.7 F (37.1 C)  SpO2: 93%     Constitutional: Alert  Eyes: Conjunctivae are normal.  Head: Atraumatic. Nose: No congestion/rhinnorhea. Mouth/Throat: Mucous membranes are moist.   Neck: Painless ROM.  Cardiovascular:   Good peripheral circulation. Respiratory: Normal respiratory effort.  Mild tachypnea with diffuse expiratory wheeze throughout  Gastrointestinal: Soft and nontender.  Musculoskeletal:  no deformity Neurologic:  MAE spontaneously. No gross focal neurologic deficits are appreciated.  Skin:  Skin is warm, dry and intact. No rash noted. Psychiatric:  Mood and affect are normal. Speech and behavior are normal.    ED Results / Procedures / Treatments   Labs (all labs ordered are listed, but only abnormal results are displayed) Labs Reviewed  CBC - Abnormal; Notable for the following components:      Result Value   WBC 14.6 (*)    All other components within normal limits  COMPREHENSIVE METABOLIC PANEL - Abnormal; Notable for the following components:   Glucose, Bld 104 (*)    Creatinine, Ser 1.31 (*)    All other components within normal limits  BRAIN NATRIURETIC PEPTIDE     EKG  ED ECG REPORT I, Merlyn Lot, the attending physician, personally viewed and interpreted this ECG.   Date: 05/21/2022  EKG Time: 17:04  Rate: 70  Rhythm: sinus  Axis: normal  Intervals: normal  ST&T Change: no stemi, nonspecific st abn    RADIOLOGY Please see ED Course for my review and interpretation.  I personally reviewed all radiographic images ordered to evaluate for the above acute complaints and reviewed radiology reports and findings.  These findings were personally discussed with the patient.  Please see medical record for radiology report.  PROCEDURES:  Critical Care performed:   Procedures   MEDICATIONS ORDERED IN ED: Medications  ipratropium-albuterol (DUONEB) 0.5-2.5 (3) MG/3ML nebulizer solution 3 mL (3 mLs Nebulization Given 05/21/22 1735)  ipratropium-albuterol (DUONEB) 0.5-2.5 (3) MG/3ML nebulizer solution 3 mL (3 mLs Nebulization Given 05/21/22 1809)  predniSONE (DELTASONE) tablet 60 mg (60 mg Oral Given 05/21/22 1809)  azithromycin (ZITHROMAX) tablet 500 mg (500 mg Oral Given 05/21/22 1810)     IMPRESSION / MDM / ASSESSMENT AND PLAN / ED COURSE  I reviewed the triage vital signs and the nursing notes.                              Differential diagnosis includes, but is not limited to, Asthma, copd, CHF, pna, ptx, malignancy, Pe, anemia  Patient presenting to the ER for evaluation of symptoms as described  above.  Based on symptoms, risk factors and considered above differential, this presenting complaint could reflect a potentially life-threatening illness therefore the patient will be placed on continuous pulse oximetry and telemetry for monitoring.  Laboratory evaluation will be sent to evaluate for the above complaints.  Patient happens diffuse wheezing on exam.  Does not appear volume overloaded.  No crackles.  EKG is nonischemic.  Does not seem consistent with ACS.  Lowers by Wells criteria does not seem consistent with PE.  Will give nebulizer.   Clinical Course as of 05/21/22 1900  Sat May 21, 2022  1727 Chest x-ray on my review and interpretation does not show any evidence of consolidation or effusion. [PR]  1758 Reassessed.  Feels improvement after the nebulizer treatment but still having diffuse wheezing.  Will give additional nebulizer will give additional steroid.  Will put on azithromycin has had similar presentation in February for which she was admitted but at that point he was hypoxic.  Will ambulate to make sure he is not hypoxic. [PR]  1854 Patient able to ambulate with steady gait.  No hypoxia.  Wheezing improved on examination.  Will will give prescription for another round of prednisone a albuterol spacer as well as Z-Pak.  We discussed strict return precautions.  Patient agreeable to plan. [PR]    Clinical Course User Index [PR] Merlyn Lot, MD     FINAL CLINICAL IMPRESSION(S) / ED DIAGNOSES   Final diagnoses:  Bronchitis  Acute cough     Rx / DC Orders   ED Discharge Orders          Ordered    predniSONE (DELTASONE) 10 MG tablet  Daily        05/21/22 1855    Spacer/Aero-Holding Chambers (AEROCHAMBER MV) inhaler        05/21/22 1855    azithromycin (ZITHROMAX) 250 MG tablet        05/21/22 1855             Note:  This document was prepared using Dragon voice recognition software and may include unintentional dictation errors.    Merlyn Lot, MD 05/21/22 1900

## 2022-05-22 ENCOUNTER — Telehealth: Payer: Self-pay | Admitting: Student in an Organized Health Care Education/Training Program

## 2022-05-22 MED ORDER — PREDNISONE 10 MG PO TABS: 10.0000 mg | ORAL_TABLET | Freq: Every day | ORAL | 0 refills | Status: DC

## 2022-05-22 NOTE — Telephone Encounter (Signed)
RX sent to [facility]

## 2022-07-18 ENCOUNTER — Emergency Department
Admission: EM | Admit: 2022-07-18 | Discharge: 2022-07-18 | Disposition: A | Discharge status: Home / Self Care | Payer: 59 | Attending: Emergency Medicine | Admitting: Emergency Medicine

## 2022-07-18 ENCOUNTER — Encounter: Payer: Self-pay | Admitting: Emergency Medicine

## 2022-07-18 ENCOUNTER — Other Ambulatory Visit: Payer: Self-pay

## 2022-07-18 DIAGNOSIS — R600 Localized edema: Secondary | ICD-10-CM | POA: Insufficient documentation

## 2022-07-18 DIAGNOSIS — R220 Localized swelling, mass and lump, head: Secondary | ICD-10-CM

## 2022-07-18 DIAGNOSIS — H1189 Other specified disorders of conjunctiva: Secondary | ICD-10-CM | POA: Insufficient documentation

## 2022-07-18 MED ORDER — DIPHENHYDRAMINE HCL 25 MG PO CAPS
25.0000 mg | ORAL_CAPSULE | Freq: Once | ORAL | Status: AC
  Administered 2022-07-18: 25 mg via ORAL
  Filled 2022-07-18: qty 1

## 2022-07-18 MED ORDER — DIPHENHYDRAMINE HCL 25 MG PO CAPS: 25.0000 mg | ORAL_CAPSULE | Freq: Four times a day (QID) | ORAL | 0 refills | Status: DC | PRN

## 2022-07-18 MED ORDER — PREDNISONE 20 MG PO TABS
60.0000 mg | ORAL_TABLET | Freq: Once | ORAL | Status: AC
  Administered 2022-07-18: 60 mg via ORAL
  Filled 2022-07-18: qty 3

## 2022-07-18 MED ORDER — PREDNISONE 50 MG PO TABS: 50.0000 mg | ORAL_TABLET | Freq: Every day | ORAL | 0 refills | Status: DC

## 2022-07-18 NOTE — ED Triage Notes (Signed)
Patient to ED for left eye swelling. States last night this happened was when he ate shrimp. Woke up with the swelling today. Also, stating a sore throat but went away after taking tylenol. And left shoulder pain x1 week- no known injury.

## 2022-07-18 NOTE — ED Provider Notes (Signed)
[facility] Provider Note    Event Date/Time   First MD Initiated Contact with Patient 07/18/22 [phone]     (approximate)   History   Facial Swelling   HPI  Michael Ross is a 52 y.o. male who presents with complaints of swelling around his left eye and itching.  He reports he first noticed this about 45 minutes ago.  He is concerned because he has had angioedema in the past related to lisinopril which caused swelling of his lips.  He no longer takes lisinopril however he does take losartan.  No intraoral swelling, no tongue swelling, no difficulty breathing, no rash     Physical Exam   Triage Vital Signs: ED Triage Vitals  Enc Vitals Group     BP 07/18/22 0809 (!) 133/100     Pulse Rate 07/18/22 0809 84     Resp 07/18/22 0809 18     Temp 07/18/22 0809 98 F (36.7 C)     Temp Source 07/18/22 0809 Oral     SpO2 07/18/22 0809 93 %     Weight 07/18/22 0820 122.5 kg (270 lb 1 oz)     Height 07/18/22 0820 1.651 m ('5\' 5"'$ )     Head Circumference --      Peak Flow --      Pain Score 07/18/22 0808 7     Pain Loc --      Pain Edu? --      Excl. in [place]? --     Most recent vital signs: Vitals:   07/18/22 0809  BP: (!) 133/100  Pulse: 84  Resp: 18  Temp: 98 F (36.7 C)  SpO2: 93%     General: Awake, no distress.  CV:  Good peripheral perfusion.  Resp:  Normal effort.  Abd:  No distention.  Other:  Left eye: Mild erythema of the conjunctive, some periorbital swelling/edema No intra oral swelling, tongue normal, pharynx normal   ED Results / Procedures / Treatments   Labs (all labs ordered are listed, but only abnormal results are displayed) Labs Reviewed - No data to display   EKG     RADIOLOGY     PROCEDURES:  Critical Care performed:   Procedures   MEDICATIONS ORDERED IN ED: Medications  diphenhydrAMINE (BENADRYL) capsule 25 mg (25 mg Oral Given 07/18/22 0827)  predniSONE (DELTASONE) tablet 60 mg (60 mg Oral Given  07/18/22 0827)     IMPRESSION / MDM / ASSESSMENT AND PLAN / ED COURSE  I reviewed the triage vital signs and the nursing notes. Patient's presentation is most consistent with acute presentation with potential threat to life or bodily function.  Patient presents with evidence of swelling to the left orbit.  Differential includes allergic reaction, angioedema, conjunctivitis, infection  Unilateral swelling only, no intraoral or lip swelling, doubt angioedema related to ARB  Possible food or contact allergy, will give p.o. prednisone, p.o. Benadryl and observe in the emergency department  ----------------------------------------- 9:41 AM on 07/18/2022 ----------------------------------------- Symptoms stable, no worsening, no intraoral symptoms, no rash, appropriate for discharge at this time with strict return precautions, patient agrees with this plan      FINAL CLINICAL IMPRESSION(S) / ED DIAGNOSES   Final diagnoses:  Facial swelling     Rx / DC Orders   ED Discharge Orders          Ordered    predniSONE (DELTASONE) 50 MG tablet  Daily with breakfast        07/18/22  [id number]    diphenhydrAMINE (BENADRYL ALLERGY) 25 mg capsule  Every 6 hours PRN        07/18/22 0933             Note:  This document was prepared using Dragon voice recognition software and may include unintentional dictation errors.   Lavonia Drafts, MD 07/18/22 [phone]

## 2022-07-23 ENCOUNTER — Other Ambulatory Visit: Payer: Self-pay

## 2022-07-23 ENCOUNTER — Emergency Department
Admission: EM | Admit: 2022-07-23 | Discharge: 2022-07-23 | Disposition: A | Discharge status: Home / Self Care | Payer: 59 | Attending: Emergency Medicine | Admitting: Emergency Medicine

## 2022-07-23 ENCOUNTER — Emergency Department: Payer: 59

## 2022-07-23 DIAGNOSIS — J449 Chronic obstructive pulmonary disease, unspecified: Secondary | ICD-10-CM | POA: Diagnosis not present

## 2022-07-23 DIAGNOSIS — M25512 Pain in left shoulder: Secondary | ICD-10-CM | POA: Insufficient documentation

## 2022-07-23 MED ORDER — KETOROLAC TROMETHAMINE 15 MG/ML IJ SOLN
15.0000 mg | Freq: Once | INTRAMUSCULAR | Status: AC
  Administered 2022-07-23: 15 mg via INTRAMUSCULAR
  Filled 2022-07-23: qty 1

## 2022-07-23 MED ORDER — NAPROXEN 500 MG PO TABS: 500.0000 mg | ORAL_TABLET | Freq: Two times a day (BID) | ORAL | 0 refills | Status: AC

## 2022-07-23 NOTE — Discharge Instructions (Addendum)
You may take the medication as prescribed to help with your symptoms.  Please follow-up with orthopedics for further management as we discussed.  Please return for any new, worsening, or change in symptoms or other concerns.  You may wear the sling for comfort, though remember to take your arm out and perform gentle range of motion exercises as we discussed several times per day. It was a pleasure caring for you today.

## 2022-07-23 NOTE — ED Provider Notes (Signed)
[facility] Provider Note    Event Date/Time   First MD Initiated Contact with Patient 07/23/22 1226     (approximate)   History   Shoulder Pain   HPI  Michael Ross is a 52 y.o. male with a past medical history of COPD who presents today for evaluation of left shoulder pain.  Patient reports that his pain has been persistent for several weeks, but worsened over the past 2 days.  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  He specifically has pain with any movement of his left shoulder.  He reports that the pain radiates down to his deltoid area.  No chest pain or shortness of breath.  No exertional symptoms.  No neck pain or jaw pain.  No decreased grip strength or weakness in his arm.  Patient Active Problem List   Diagnosis Date Noted   Acute exacerbation of chronic obstructive pulmonary disease (COPD) ([place]) 06/18/2021   Hypoxia 06/17/2021   COPD (chronic obstructive pulmonary disease) ([place]) 06/17/2021          Physical Exam   Triage Vital Signs: ED Triage Vitals  Enc Vitals Group     BP 07/23/22 1216 123/75     Pulse Rate 07/23/22 1216 76     Resp 07/23/22 1216 18     Temp 07/23/22 1216 97.9 F (36.6 C)     Temp Source 07/23/22 1216 Oral     SpO2 07/23/22 1216 96 %     Weight 07/23/22 1213 250 lb (113.4 kg)     Height 07/23/22 1213 '5\' 5"'$  (1.651 m)     Head Circumference --      Peak Flow --      Pain Score 07/23/22 1212 9     Pain Loc --      Pain Edu? --      Excl. in [place]? --     Most recent vital signs: Vitals:   07/23/22 1216  BP: 123/75  Pulse: 76  Resp: 18  Temp: 97.9 F (36.6 C)  SpO2: 96%    Physical Exam Vitals and nursing note reviewed.  Constitutional:      General: Awake and alert. No acute distress.    Appearance: Normal appearance. The patient is obese.  HENT:     Head: Normocephalic and atraumatic.     Mouth: Mucous membranes are  moist.  Eyes:     General: PERRL. Normal EOMs        Right eye: No discharge.        Left eye: No discharge.     Conjunctiva/sclera: Conjunctivae normal.  Cardiovascular:     Rate and Rhythm: Normal rate and regular rhythm.     Pulses: Normal pulses.  Pulmonary:     Effort: Pulmonary effort is normal. No respiratory distress.     Breath sounds: Normal breath sounds.  Abdominal:     Abdomen is soft. There is no abdominal tenderness. No rebound or guarding. No distention. Musculoskeletal:        General: No swelling. Normal range of motion.     Cervical back: Normal range of motion and neck supple.  Left shoulder: No obvious deformity, swelling, ecchymosis, or erythema No clavicular or AC joint tenderness Able to actively and passively forward flex and abduct at shoulder fully though significant pain past 90 degrees, negative drop arm test Pain with Obriens, SLAP, empty can, and lift  off tests Normal internal and external rotation against resistance Positive Hawkins and Neers Normal ROM at elbow and wrist Normal resisted pronation and supination 2+ radial pulse Normal grip strength Normal intrinsic hand muscle function Skin:    General: Skin is warm and dry.     Capillary Refill: Capillary refill takes less than 2 seconds.     Findings: No rash.  Neurological:     Mental Status: The patient is awake and alert.      ED Results / Procedures / Treatments   Labs (all labs ordered are listed, but only abnormal results are displayed) Labs Reviewed - No data to display   EKG     RADIOLOGY I independently reviewed and interpreted imaging and agree with radiologists findings.     PROCEDURES:  Critical Care performed:   Procedures   MEDICATIONS ORDERED IN ED: Medications  ketorolac (TORADOL) 15 MG/ML injection 15 mg (15 mg Intramuscular Given 07/23/22 1314)     IMPRESSION / MDM / ASSESSMENT AND PLAN / ED COURSE  I reviewed the triage vital signs and the nursing  notes.   Differential diagnosis includes, but is not limited to, calcific tendinitis, rotator cuff injury, muscle strain, muscle spasm, osteoarthritis, impingement syndrome, less likely fracture or dislocation.  Patient presents emergency department hemodynamically stable and afebrile.  He is nontoxic in appearance.  He has no chest pain, shortness of breath, nausea, diaphoresis, or exertional symptoms to suggest acute coronary syndrome or cardiac etiology of his symptoms today.  He is pain is quite easily reproduced with any movement of the shoulder and palpation of the area.  X-ray reveals degenerative changes at the acromioclavicular joint with a small calcified body in the subcoracoid region.  He may have a component of calcific tendinitis.  He was placed in a sling for comfort, though advised of the risk of adhesive capsulitis and instructed to perform gentle range of motion exercises multiple times per day, these were demonstrated for him.  He was given a dose of Toradol, most recent GFR within normal limits.  He was started on naproxen.  He was given a work note, as requested.  He was instructed to follow-up with orthopedics and the appropriate information was provided.  We discussed return precautions and the importance of close outpatient follow-up.  Patient understands and agrees with plan.  He was discharged in stable condition.   Patient's presentation is most consistent with acute complicated illness / injury requiring diagnostic workup.     FINAL CLINICAL IMPRESSION(S) / ED DIAGNOSES   Final diagnoses:  Acute pain of left shoulder     Rx / DC Orders   ED Discharge Orders          Ordered    naproxen (NAPROSYN) 500 MG tablet  2 times daily with meals        07/23/22 1305             Note:  This document was prepared using Dragon voice recognition software and may include unintentional dictation errors.   Emeline Gins 07/23/22 1332    Harvest Dark,  MD 07/23/22 1845

## 2022-07-23 NOTE — ED Triage Notes (Signed)
Pt presents via POV c/o left shoulder pain started last night. Denies injury. Reports pain worse with movement.

## 2022-07-25 ENCOUNTER — Emergency Department
Admission: EM | Admit: 2022-07-25 | Discharge: 2022-07-25 | Disposition: A | Discharge status: Home / Self Care | Payer: 59 | Attending: Emergency Medicine | Admitting: Emergency Medicine

## 2022-07-25 DIAGNOSIS — M25512 Pain in left shoulder: Secondary | ICD-10-CM | POA: Insufficient documentation

## 2022-07-25 MED ORDER — METHYLPREDNISOLONE 4 MG PO TBPK: ORAL_TABLET | ORAL | 0 refills | Status: DC

## 2022-07-25 MED ORDER — DEXAMETHASONE SODIUM PHOSPHATE 10 MG/ML IJ SOLN
10.0000 mg | Freq: Once | INTRAMUSCULAR | Status: AC
  Administered 2022-07-25: 10 mg via INTRAMUSCULAR
  Filled 2022-07-25: qty 1

## 2022-07-25 NOTE — ED Provider Notes (Signed)
[facility] Provider Note  Patient Contact: 8:26 PM (approximate)   History   Shoulder Pain   HPI  Michael Ross is a 52 y.o. male presents to the emergency department after patient has not experienced significant improvement in left shoulder pain with taking anti-inflammatories.  Patient localizes pain to the lateral aspect of the left shoulder and pain is worsened with abduction.  No falls or other mechanisms of trauma.      Physical Exam   Triage Vital Signs: ED Triage Vitals  Enc Vitals Group     BP 07/25/22 1659 121/76     Pulse Rate 07/25/22 1659 76     Resp 07/25/22 1659 18     Temp 07/25/22 1659 98.3 F (36.8 C)     Temp Source 07/25/22 1659 Oral     SpO2 07/25/22 1659 98 %     Weight 07/25/22 1653 249 lb 12.5 oz (113.3 kg)     Height 07/25/22 1653 '5\' 5"'$  (1.651 m)     Head Circumference --      Peak Flow --      Pain Score 07/25/22 1653 10     Pain Loc --      Pain Edu? --      Excl. in [place]? --     Most recent vital signs: Vitals:   07/25/22 1659 07/25/22 1829  BP: 121/76   Pulse: 76   Resp: 18   Temp: 98.3 F (36.8 C)   SpO2: 98% 100%     General: Alert and in no acute distress. Eyes:  PERRL. EOMI. Head: No acute traumatic findings ENT:      Nose: No congestion/rhinnorhea.      Mouth/Throat: Mucous membranes are moist.  Neck: No stridor. No cervical spine tenderness to palpation. Cardiovascular:  Good peripheral perfusion Respiratory: Normal respiratory effort without tachypnea or retractions. Lungs CTAB. Good air entry to the bases with no decreased or absent breath sounds. Gastrointestinal: Bowel sounds 4 quadrants. Soft and nontender to palpation. No guarding or rigidity. No palpable masses. No distention. No CVA tenderness. Musculoskeletal: Full range of motion to all extremities.  Neurologic:  No gross focal neurologic deficits are appreciated.  Skin:   No rash noted   ED Results / Procedures / Treatments    Labs (all labs ordered are listed, but only abnormal results are displayed) Labs Reviewed - No data to display      PROCEDURES:  Critical Care performed: No  Procedures   MEDICATIONS ORDERED IN ED: Medications  dexamethasone (DECADRON) injection 10 mg (10 mg Intramuscular Given 07/25/22 1827)     IMPRESSION / MDM / [place] / ED COURSE  I reviewed the triage vital signs and the nursing notes.                              Assessment and plan Left shoulder pain 52 year old male presents to the emergency department with persistent left shoulder pain despite anti-inflammatories at home.  Patient was started on Medrol Dosepak and given injection of Decadron while in the emergency department.  He was given a follow-up referral to orthopedics.      FINAL CLINICAL IMPRESSION(S) / ED DIAGNOSES   Final diagnoses:  Acute pain of left shoulder     Rx / DC Orders   ED Discharge Orders          Ordered    methylPREDNISolone (MEDROL DOSEPAK) 4 MG  TBPK tablet        07/25/22 1817             Note:  This document was prepared using Dragon voice recognition software and may include unintentional dictation errors.   [name], PA-C 07/25/22 2028    Naaman Plummer, MD 07/26/22 [phone]

## 2022-07-25 NOTE — Discharge Instructions (Addendum)
Take Medrol dose pack as directed.

## 2022-07-25 NOTE — ED Notes (Signed)
Pt states he last took naproxen at 12-1pm, with no relief. Pt states he has to go to work tonight. Pt requesting a work note.

## 2022-07-25 NOTE — ED Triage Notes (Signed)
Pt presents to the ED via POV due to shoulder pai. Pt was seen here 07/23/22 for the same complaint. Pt states the medication is not helping. Pt also states he can not take ibuprofen products because "I have CHF". Pt NAD

## 2023-06-02 ENCOUNTER — Ambulatory Visit (HOSPITAL_COMMUNITY)
Admission: EM | Admit: 2023-06-02 | Discharge: 2023-06-02 | Disposition: A | Discharge status: Home / Self Care | Payer: Self-pay | Attending: Internal Medicine | Admitting: Internal Medicine

## 2023-06-02 ENCOUNTER — Encounter (HOSPITAL_COMMUNITY): Payer: Self-pay | Admitting: *Deleted

## 2023-06-02 ENCOUNTER — Ambulatory Visit (INDEPENDENT_AMBULATORY_CARE_PROVIDER_SITE_OTHER): Payer: Self-pay

## 2023-06-02 DIAGNOSIS — R051 Acute cough: Secondary | ICD-10-CM

## 2023-06-02 DIAGNOSIS — R0602 Shortness of breath: Secondary | ICD-10-CM

## 2023-06-02 DIAGNOSIS — J189 Pneumonia, unspecified organism: Secondary | ICD-10-CM

## 2023-06-02 MED ORDER — PROMETHAZINE-DM 6.25-15 MG/5ML PO SYRP: 5.0000 mL | ORAL_SOLUTION | Freq: Three times a day (TID) | ORAL | 0 refills | Status: DC | PRN

## 2023-06-02 MED ORDER — ALBUTEROL SULFATE (2.5 MG/3ML) 0.083% IN NEBU
2.5000 mg | INHALATION_SOLUTION | Freq: Once | RESPIRATORY_TRACT | Status: AC
  Administered 2023-06-02: 2.5 mg via RESPIRATORY_TRACT

## 2023-06-02 MED ORDER — ALBUTEROL SULFATE (2.5 MG/3ML) 0.083% IN NEBU
INHALATION_SOLUTION | RESPIRATORY_TRACT | Status: AC
  Filled 2023-06-02: qty 3

## 2023-06-02 MED ORDER — AZITHROMYCIN 250 MG PO TABS: 250.0000 mg | ORAL_TABLET | Freq: Every day | ORAL | 0 refills | Status: DC

## 2023-06-02 MED ORDER — PREDNISONE 20 MG PO TABS: 40.0000 mg | ORAL_TABLET | Freq: Every day | ORAL | 0 refills | Status: AC

## 2023-06-02 NOTE — Discharge Instructions (Addendum)
Chest x-ray done today. We are waiting for the final read from the radiologist but on brief overview there may be a right side pneumonia. Given this we will treat with the following:  Albuterol nebulizer treatment done today. Azithromycin 250mg  Take 2 tablets today and then 1 tablet daily for 4 more days. Prednisone 40 mg daily for 5 days. Take this in the morning. Promethazine DM 5 mL every 8 hours as needed for cough.  Use caution as this medication can cause drowsiness. Albuterol inhaler 1-2 puffs every 6 hours as needed for wheezing/shortness of breath.  Rest and stay hydrated Return to urgent care or PCP if symptoms worsen or fail to resolve.

## 2023-06-02 NOTE — ED Provider Notes (Signed)
[facility]    CSN: [id number] Arrival date & time: 06/02/23  1427      History   Chief Complaint Chief Complaint  Patient presents with   Cough   Nasal Congestion    HPI Michael Ross is a 53 y.o. male.   53 y.o. male who presents to urgent care with complaints of 4-5 days cough, congestion, shortness of breath. Has an inhaler but hasn't been using it even though he feels he needs it.  He has felt feverish but has not taken his temperature.  He is having chills.  He reports that the cough is very severe and is productive.  He does have a family member that had COVID about 2 weeks ago but was not around them much as the person stayed in the room for 5 days.  He has been using over-the-counter cough and cold medication without relief.  He reports he does not have COPD which is in his medical history but reports that he was actually diagnosed with CHF.   Cough Associated symptoms: chills and shortness of breath   Associated symptoms: no chest pain, no ear pain, no fever, no rash and no sore throat     Past Medical History:  Diagnosis Date   CHF (congestive heart failure) (HCC)    Complication of anesthesia    Pt was told by [facility] that he couldn't be put to sleep because of his CHF   Hypertension     Patient Active Problem List   Diagnosis Date Noted   Acute exacerbation of chronic obstructive pulmonary disease (COPD) (HCC) 06/18/2021   Hypoxia 06/17/2021   COPD (chronic obstructive pulmonary disease) (HCC) 06/17/2021    Past Surgical History:  Procedure Laterality Date   CARDIAC CATHETERIZATION  04/13/2016   CONTINUOUS NERVE MONITORING Left 02/04/2022   Procedure: FACIAL NERVE MONITORING;  Surgeon: Linus Salmons, MD;  Location: [facility];  Service: ENT;  Laterality: Left;   LIPOMA EXCISION Left 02/04/2022   Procedure: EXCISION TEMPORAL LIPOMA WITH COMPLEX CLOSURE;  Surgeon: Linus Salmons, MD;  Location: [facility];  Service: ENT;   Laterality: Left;  needs potassium check       Home Medications    Prior to Admission medications   Medication Sig Start Date End Date Taking? Authorizing Provider  furosemide (LASIX) 40 MG tablet Take 1 tablet (40 mg total) by mouth 2 (two) times daily. Patient taking differently: Take 80 mg by mouth 2 (two) times daily. 06/19/21 06/02/23 Yes Standley Brooking, MD  losartan (COZAAR) 100 MG tablet Take 100 mg by mouth daily. 06/06/21  Yes [provider]  metoprolol (TOPROL-XL) 200 MG 24 hr tablet Take 200 mg by mouth daily. 05/03/21  Yes [provider]  spironolactone (ALDACTONE) 25 MG tablet Take 100 mg by mouth daily. 06/06/21  Yes [provider]  albuterol (VENTOLIN HFA) 108 (90 Base) MCG/ACT inhaler Inhale 2 puffs into the lungs every 6 (six) hours as needed for wheezing or shortness of breath. 05/06/22   Orvil Feil, PA-C  atorvastatin (LIPITOR) 10 MG tablet Take 1 tablet (10 mg total) by mouth daily. 06/18/21 08/20/26  Darlin Drop, DO  azithromycin (ZITHROMAX) 250 MG tablet Take one daily 05/21/22   Willy Eddy, MD  diphenhydrAMINE (BENADRYL ALLERGY) 25 mg capsule Take 1 capsule (25 mg total) by mouth every 6 (six) hours as needed. 07/18/22   Jene Every, MD  HYDROcodone-Acetaminophen 5-300 MG TABS Take 1-2 tablets by mouth every 4 (four) hours  as needed. 02/04/22   Linus Salmons, MD  ipratropium (ATROVENT HFA) 17 MCG/ACT inhaler Inhale 1 puff into the lungs 3 (three) times daily. 05/06/22 05/06/23  Orvil Feil, PA-C  methylPREDNISolone (MEDROL DOSEPAK) 4 MG TBPK tablet 6,5,4,3,2,1 07/25/22   Orvil Feil, PA-C  nicotine (NICODERM CQ - DOSED IN MG/24 HOURS) 14 mg/24hr patch Place 1 patch (14 mg total) onto the skin daily. Patient not taking: Reported on 12/29/2021 06/19/21   Darlin Drop, DO  predniSONE (DELTASONE) 50 MG tablet Take 1 tablet (50 mg total) by mouth daily with breakfast. 07/19/22   Jene Every, MD  sildenafil (VIAGRA) 100 MG  tablet Take 100 mg by mouth daily as needed. 12/20/20   [provider]  Spacer/Aero-Holding Chambers (AEROCHAMBER MV) inhaler Use as instructed 05/21/22   Willy Eddy, MD    Family History History reviewed. No pertinent family history.  Social History Social History   Tobacco Use   Smoking status: Every Day    Current packs/day: 0.00    Average packs/day: 0.5 packs/day for 20.0 years (10.0 ttl pk-yrs)    Types: Cigarettes    Start date: 11/09/1995    Last attempt to quit: 11/09/2015    Years since quitting: 7.5   Smokeless tobacco: Never   Tobacco comments:    Has smoked "Off and on" since age 22  Vaping Use   Vaping status: Never Used  Substance Use Topics   Alcohol use: Yes    Comment: 1-2 beers twice a week   Drug use: No     Allergies   Ibuprofen and Lisinopril   Review of Systems Review of Systems  Constitutional:  Positive for chills. Negative for fever.  HENT:  Positive for congestion. Negative for ear pain and sore throat.   Eyes:  Negative for pain and visual disturbance.  Respiratory:  Positive for cough and shortness of breath.   Cardiovascular:  Negative for chest pain and palpitations.  Gastrointestinal:  Negative for abdominal pain and vomiting.  Genitourinary:  Negative for dysuria and hematuria.  Musculoskeletal:  Negative for arthralgias and back pain.  Skin:  Negative for color change and rash.  Neurological:  Negative for seizures and syncope.  All other systems reviewed and are negative.    Physical Exam Triage Vital Signs ED Triage Vitals  Encounter Vitals Group     BP 06/02/23 1603 115/78     Systolic BP Percentile --      Diastolic BP Percentile --      Pulse Rate 06/02/23 1603 80     Resp 06/02/23 1603 (!) 22     Temp 06/02/23 1603 98.6 F (37 C)     Temp Source 06/02/23 1603 Oral     SpO2 06/02/23 1603 91 %     Weight --      Height --      Head Circumference --      Peak Flow --      Pain Score 06/02/23 1600 0      Pain Loc --      Pain Education --      Exclude from Growth Chart --    No data found.  Updated Vital Signs BP 115/78 (BP Location: Left Arm)   Pulse 80   Temp 98.6 F (37 C) (Oral)   Resp (!) 22   SpO2 91%   Visual Acuity Right Eye Distance:   Left Eye Distance:   Bilateral Distance:    Right Eye Near:  Left Eye Near:    Bilateral Near:     Physical Exam Vitals and nursing note reviewed.  Constitutional:      General: He is not in acute distress.    Appearance: He is well-developed.  HENT:     Head: Normocephalic and atraumatic.     Right Ear: Tympanic membrane normal.     Left Ear: Tympanic membrane normal.     Nose: Nose normal.     Mouth/Throat:     Mouth: Mucous membranes are moist.  Eyes:     Conjunctiva/sclera: Conjunctivae normal.  Cardiovascular:     Rate and Rhythm: Normal rate and regular rhythm.     Heart sounds: No murmur heard. Pulmonary:     Effort: Pulmonary effort is normal. No respiratory distress.     Breath sounds: Examination of the right-upper field reveals decreased breath sounds and rhonchi. Examination of the left-upper field reveals decreased breath sounds and rhonchi. Examination of the right-middle field reveals decreased breath sounds. Examination of the left-middle field reveals decreased breath sounds. Examination of the right-lower field reveals decreased breath sounds. Examination of the left-lower field reveals decreased breath sounds. Decreased breath sounds and rhonchi present. No wheezing.  Abdominal:     Palpations: Abdomen is soft.     Tenderness: There is no abdominal tenderness.  Musculoskeletal:        General: No swelling.     Cervical back: Neck supple.  Skin:    General: Skin is warm and dry.     Capillary Refill: Capillary refill takes less than 2 seconds.  Neurological:     Mental Status: He is alert.  Psychiatric:        Mood and Affect: Mood normal.      UC Treatments / Results  Labs (all labs ordered are  listed, but only abnormal results are displayed) Labs Reviewed - No data to display  EKG   Radiology No results found.  Procedures Procedures (including critical care time)  Medications Ordered in UC Medications - No data to display  Initial Impression / Assessment and Plan / UC Course  I have reviewed the triage vital signs and the nursing notes.  Pertinent labs & imaging results that were available during my care of the patient were reviewed by me and considered in my medical decision making (see chart for details).     Shortness of breath - Plan: DG Chest 2 View, DG Chest 2 View  Acute cough - Plan: DG Chest 2 View, DG Chest 2 View  Pneumonia of right middle lobe due to infectious organism   Chest x-ray done today. We are waiting for the final read from the radiologist but on brief overview there may be a right side pneumonia. Given this we will treat with the following:  Albuterol nebulizer treatment done today. Azithromycin 250mg  Take 2 tablets today and then 1 tablet daily for 4 more days. Prednisone 40 mg daily for 5 days. Take this in the morning. Promethazine DM 5 mL every 8 hours as needed for cough.  Use caution as this medication can cause drowsiness. Albuterol inhaler 1-2 puffs every 6 hours as needed for wheezing/shortness of breath.  Rest and stay hydrated Return to urgent care or PCP if symptoms worsen or fail to resolve.    Final Clinical Impressions(s) / UC Diagnoses   Final diagnoses:  None   Discharge Instructions   None    ED Prescriptions   None    PDMP not reviewed this encounter.  Landis Martins, [place] 06/02/23 1715

## 2023-06-02 NOTE — ED Triage Notes (Signed)
Pt states he has cough and congestion he has been taking OTC meds without relief. He has an albuterol MDI but isnt using it. Hre does complain of wheezing sometimes.

## 2024-05-23 ENCOUNTER — Other Ambulatory Visit: Payer: Self-pay

## 2024-05-23 ENCOUNTER — Emergency Department
Admission: EM | Admit: 2024-05-23 | Discharge: 2024-05-24 | Disposition: A | Discharge status: Home / Self Care | Attending: Emergency Medicine | Admitting: Emergency Medicine

## 2024-05-23 DIAGNOSIS — R0602 Shortness of breath: Secondary | ICD-10-CM | POA: Diagnosis not present

## 2024-05-23 DIAGNOSIS — D72829 Elevated white blood cell count, unspecified: Secondary | ICD-10-CM | POA: Diagnosis not present

## 2024-05-23 DIAGNOSIS — J069 Acute upper respiratory infection, unspecified: Secondary | ICD-10-CM | POA: Diagnosis not present

## 2024-05-23 DIAGNOSIS — I509 Heart failure, unspecified: Secondary | ICD-10-CM | POA: Insufficient documentation

## 2024-05-23 DIAGNOSIS — J449 Chronic obstructive pulmonary disease, unspecified: Secondary | ICD-10-CM | POA: Diagnosis not present

## 2024-05-23 DIAGNOSIS — J111 Influenza due to unidentified influenza virus with other respiratory manifestations: Secondary | ICD-10-CM

## 2024-05-23 DIAGNOSIS — R059 Cough, unspecified: Secondary | ICD-10-CM | POA: Diagnosis present

## 2024-05-23 NOTE — ED Triage Notes (Signed)
 Pt reports sob and cough for the past week, pt states it has gotten worse over last few days. SOB worse when laying flat, pt does have hx chf, has been taking lasix  as prescribed. Pt reports some chest discomfort when he coughs.

## 2024-05-24 ENCOUNTER — Emergency Department

## 2024-05-24 LAB — CBC WITH DIFFERENTIAL/PLATELET
Abs Immature Granulocytes: 0.09 K/uL — ABNORMAL HIGH (ref 0.00–0.07)
Basophils Absolute: 0.1 K/uL (ref 0.0–0.1)
Basophils Relative: 1 %
Eosinophils Absolute: 0.4 K/uL (ref 0.0–0.5)
Eosinophils Relative: 2 %
HCT: 49.2 % (ref 39.0–52.0)
Hemoglobin: 15.2 g/dL (ref 13.0–17.0)
Immature Granulocytes: 1 %
Lymphocytes Relative: 20 %
Lymphs Abs: 3.1 K/uL (ref 0.7–4.0)
MCH: 27.7 pg (ref 26.0–34.0)
MCHC: 30.9 g/dL (ref 30.0–36.0)
MCV: 89.8 fL (ref 80.0–100.0)
Monocytes Absolute: 0.9 K/uL (ref 0.1–1.0)
Monocytes Relative: 6 %
Neutro Abs: 11 K/uL — ABNORMAL HIGH (ref 1.7–7.7)
Neutrophils Relative %: 70 %
Platelets: 345 K/uL (ref 150–400)
RBC: 5.48 MIL/uL (ref 4.22–5.81)
RDW: 13.7 % (ref 11.5–15.5)
WBC: 15.6 K/uL — ABNORMAL HIGH (ref 4.0–10.5)
nRBC: 0 % (ref 0.0–0.2)

## 2024-05-24 LAB — BASIC METABOLIC PANEL WITH GFR
Anion gap: 13 (ref 5–15)
BUN: 19 mg/dL (ref 6–20)
CO2: 27 mmol/L (ref 22–32)
Calcium: 8.8 mg/dL — ABNORMAL LOW (ref 8.9–10.3)
Chloride: 97 mmol/L — ABNORMAL LOW (ref 98–111)
Creatinine, Ser: 1.41 mg/dL — ABNORMAL HIGH (ref 0.61–1.24)
GFR, Estimated: >60 mL/min
Glucose, Bld: 90 mg/dL (ref 70–99)
Potassium: 4.3 mmol/L (ref 3.5–5.1)
Sodium: 137 mmol/L (ref 135–145)

## 2024-05-24 LAB — PRO BRAIN NATRIURETIC PEPTIDE: Pro Brain Natriuretic Peptide: 51.4 pg/mL

## 2024-05-24 MED ORDER — HYDROCOD POLI-CHLORPHE POLI ER 10-8 MG/5ML PO SUER: 5.0000 mL | Freq: Two times a day (BID) | ORAL | 0 refills | Status: DC | PRN

## 2024-05-24 MED ORDER — BENZONATATE 100 MG PO CAPS: 100.0000 mg | ORAL_CAPSULE | Freq: Three times a day (TID) | ORAL | 0 refills | Status: DC | PRN

## 2024-05-24 NOTE — Discharge Instructions (Signed)
 You have been seen in the Emergency Department (ED) today for a likely viral illness.  Please drink plenty of clear fluids (water, Gatorade, chicken broth, etc).  You may use Tylenol and/or Motrin according to label instructions.  You can alternate between the two without any side effects.   Please follow up with your doctor as listed above.  Call your doctor or return to the Emergency Department (ED) if you are unable to tolerate fluids due to vomiting, have worsening trouble breathing, become extremely tired or difficult to awaken, or if you develop any other symptoms that concern you.

## 2024-05-24 NOTE — ED Provider Notes (Addendum)
 "  [facility] Provider Note    Event Date/Time   First MD Initiated Contact with Patient 05/24/24 0003     (approximate)   History   Shortness of Breath and Cough   HPI Michael Ross is a 54 y.o. male whose medical history reportedly includes COPD and CHF.  He presents tonight for evaluation of about a week of symptoms including shortness of breath and cough.  He said he was worse that it might have something to do with his CHF although he says he has been taking his Lasix  as previously prescribed.  He smokes but not much.  He said that he started having symptoms right around [place] with a scratchy throat and some nasal congestion and then the symptoms have gotten slightly worse since then.     Physical Exam   Triage Vital Signs: ED Triage Vitals  Encounter Vitals Group     BP 05/23/24 2356 127/73     Girls Systolic BP Percentile --      Girls Diastolic BP Percentile --      Boys Systolic BP Percentile --      Boys Diastolic BP Percentile --      Pulse Rate 05/23/24 2356 77     Resp 05/23/24 2356 (!) 22     Temp 05/23/24 2356 98.7 F (37.1 C)     Temp src --      SpO2 05/23/24 2356 92 %     Weight 05/23/24 2355 117.9 kg (260 lb)     Height 05/23/24 2355 1.651 m (5' 5)     Head Circumference --      Peak Flow --      Pain Score 05/23/24 2355 8     Pain Loc --      Pain Education --      Exclude from Growth Chart --     Most recent vital signs: Vitals:   05/23/24 2356 05/24/24 0018  BP: 127/73   Pulse: 77 82  Resp: (!) 22   Temp: 98.7 F (37.1 C)   SpO2: 92% 95%    General: Awake, alert, pleasant and conversant.  When I walked in the room he was lying completely flat in a prone position in no distress. CV:  Good peripheral perfusion.  Regular rate and rhythm, normal heart sounds. Resp:  Normal effort. Speaking easily and comfortably, no accessory muscle usage nor intercostal retractions.  Lungs are clear to auscultation.  He  coughs relatively frequently, but he has no wheezing, rales, nor rhonchi. Abd:  Obese but no other abdominal distention.   ED Results / Procedures / Treatments   Labs (all labs ordered are listed, but only abnormal results are displayed) Labs Reviewed  CBC WITH DIFFERENTIAL/PLATELET - Abnormal; Notable for the following components:      Result Value   WBC 15.6 (*)    Neutro Abs 11.0 (*)    Abs Immature Granulocytes 0.09 (*)    All other components within normal limits  BASIC METABOLIC PANEL WITH GFR - Abnormal; Notable for the following components:   Chloride 97 (*)    Creatinine, Ser 1.41 (*)    Calcium  8.8 (*)    All other components within normal limits  PRO BRAIN NATRIURETIC PEPTIDE     EKG  ED ECG REPORT I, Darleene Dome, the attending physician, personally viewed and interpreted this ECG.  Date: 05/23/2024 EKG Time: 23: 59 Rate: 80 Rhythm: normal sinus rhythm QRS Axis: normal Intervals: normal  ST/T Wave abnormalities: normal Narrative Interpretation: no evidence of acute ischemia    RADIOLOGY I independently viewed and interpreted patient's two-view chest x-ray and I see no evidence of pneumonia or pulmonary edema.  I also read the radiologist's report, which confirmed no acute findings.  I also independently viewed and interpreted the patient's CT chest without contrast and I see no evidence of interstitial edema or pneumonia that was not previously seen on x-ray.  I also read the radiologist's report, which confirmed no acute findings.   PROCEDURES:  Critical Care performed: No  Procedures    IMPRESSION / MDM / ASSESSMENT AND PLAN / ED COURSE  I reviewed the triage vital signs and the nursing notes.                              Differential diagnosis includes, but is not limited to, viral illness, pneumonia, CHF, COPD  Patient's presentation is most consistent with acute presentation with potential threat to life or bodily function.  Labs/studies  ordered (see ED course for additional labs and studies that may have been added later): 2 view chest x-ray, chest CT, CBC with differential, BMP, proBNP  Interventions/Medications given:  Medications - No data to display  (Note:  hospital course my include additional interventions and/or labs/studies not listed above.)   Patient has a mild leukocytosis which could be incidental, rest of his labs are stable for him including his chronic renal insufficiency.  Chest x-ray is clear and given the leukocytosis and his history of CHF and COPD I obtain a CT chest without contrast to further evaluate the lung parenchyma, and fortunately it is clear.  Strongly suspect a viral illness given how prevalent influenza is right now and the fact that his symptoms started with mild viral symptoms about a week ago.  He declined viral testing.  He will follow-up as an outpatient.  Prescriptions as listed below.  The patient's medical screening exam is reassuring with no indication of an emergent medical condition requiring hospitalization or additional evaluation at this point.  The patient is safe and appropriate for discharge and outpatient follow up.      FINAL CLINICAL IMPRESSION(S) / ED DIAGNOSES   Final diagnoses:  Influenza-like illness  Viral URI with cough     Rx / DC Orders   ED Discharge Orders          Ordered    chlorpheniramine-HYDROcodone  (TUSSIONEX) 10-8 MG/5ML  Every 12 hours PRN        05/24/24 0235    benzonatate  (TESSALON ) 100 MG capsule  3 times daily PRN        05/24/24 0235             Note:  This document was prepared using Dragon voice recognition software and may include unintentional dictation errors.   Gordan Huxley, MD 05/24/24 9764    Gordan Huxley, MD 05/24/24 0236  "

## 2024-06-15 ENCOUNTER — Observation Stay (HOSPITAL_COMMUNITY)
Admission: EM | Admit: 2024-06-15 | Discharge: 2024-06-17 | Disposition: A | Attending: Emergency Medicine | Admitting: Emergency Medicine

## 2024-06-15 ENCOUNTER — Emergency Department (HOSPITAL_COMMUNITY)

## 2024-06-15 DIAGNOSIS — E66813 Obesity, class 3: Secondary | ICD-10-CM | POA: Insufficient documentation

## 2024-06-15 DIAGNOSIS — E785 Hyperlipidemia, unspecified: Secondary | ICD-10-CM | POA: Insufficient documentation

## 2024-06-15 DIAGNOSIS — I455 Other specified heart block: Secondary | ICD-10-CM

## 2024-06-15 DIAGNOSIS — R0902 Hypoxemia: Principal | ICD-10-CM

## 2024-06-15 DIAGNOSIS — J9601 Acute respiratory failure with hypoxia: Principal | ICD-10-CM | POA: Insufficient documentation

## 2024-06-15 DIAGNOSIS — Z6841 Body Mass Index (BMI) 40.0 and over, adult: Secondary | ICD-10-CM | POA: Insufficient documentation

## 2024-06-15 DIAGNOSIS — I110 Hypertensive heart disease with heart failure: Secondary | ICD-10-CM | POA: Insufficient documentation

## 2024-06-15 DIAGNOSIS — I5030 Unspecified diastolic (congestive) heart failure: Secondary | ICD-10-CM | POA: Diagnosis present

## 2024-06-15 DIAGNOSIS — I5031 Acute diastolic (congestive) heart failure: Secondary | ICD-10-CM | POA: Insufficient documentation

## 2024-06-15 DIAGNOSIS — F17220 Nicotine dependence, chewing tobacco, uncomplicated: Secondary | ICD-10-CM | POA: Insufficient documentation

## 2024-06-15 DIAGNOSIS — J449 Chronic obstructive pulmonary disease, unspecified: Secondary | ICD-10-CM | POA: Insufficient documentation

## 2024-06-15 LAB — CBC WITH DIFFERENTIAL/PLATELET
Abs Immature Granulocytes: 0.09 10*3/uL — ABNORMAL HIGH (ref 0.00–0.07)
Basophils Absolute: 0.1 10*3/uL (ref 0.0–0.1)
Basophils Relative: 1 %
Eosinophils Absolute: 0.2 10*3/uL (ref 0.0–0.5)
Eosinophils Relative: 2 %
HCT: 47.3 % (ref 39.0–52.0)
Hemoglobin: 15.1 g/dL (ref 13.0–17.0)
Immature Granulocytes: 1 %
Lymphocytes Relative: 15 %
Lymphs Abs: 2.3 10*3/uL (ref 0.7–4.0)
MCH: 28.4 pg (ref 26.0–34.0)
MCHC: 31.9 g/dL (ref 30.0–36.0)
MCV: 89.1 fL (ref 80.0–100.0)
Monocytes Absolute: 0.9 10*3/uL (ref 0.1–1.0)
Monocytes Relative: 6 %
Neutro Abs: 11.5 10*3/uL — ABNORMAL HIGH (ref 1.7–7.7)
Neutrophils Relative %: 75 %
Platelets: 296 10*3/uL (ref 150–400)
RBC: 5.31 MIL/uL (ref 4.22–5.81)
RDW: 14 % (ref 11.5–15.5)
WBC: 15 10*3/uL — ABNORMAL HIGH (ref 4.0–10.5)
nRBC: 0 % (ref 0.0–0.2)

## 2024-06-15 LAB — RESP PANEL BY RT-PCR (RSV, FLU A&B, COVID)  RVPGX2
Influenza A by PCR: NEGATIVE
Influenza B by PCR: NEGATIVE
Resp Syncytial Virus by PCR: NEGATIVE
SARS Coronavirus 2 by RT PCR: NEGATIVE

## 2024-06-15 LAB — COMPREHENSIVE METABOLIC PANEL WITH GFR
ALT: 25 U/L (ref 0–44)
AST: 28 U/L (ref 15–41)
Albumin: 3.8 g/dL (ref 3.5–5.0)
Alkaline Phosphatase: 93 U/L (ref 38–126)
Anion gap: 9 (ref 5–15)
BUN: 15 mg/dL (ref 6–20)
CO2: 30 mmol/L (ref 22–32)
Calcium: 9.2 mg/dL (ref 8.9–10.3)
Chloride: 98 mmol/L (ref 98–111)
Creatinine, Ser: 1.21 mg/dL (ref 0.61–1.24)
GFR, Estimated: >60 mL/min
Glucose, Bld: 97 mg/dL (ref 70–99)
Potassium: 3.8 mmol/L (ref 3.5–5.1)
Sodium: 137 mmol/L (ref 135–145)
Total Bilirubin: 0.6 mg/dL (ref 0.0–1.2)
Total Protein: 8.2 g/dL — ABNORMAL HIGH (ref 6.5–8.1)

## 2024-06-15 LAB — TROPONIN T, HIGH SENSITIVITY
Troponin T High Sensitivity: 34 ng/L — ABNORMAL HIGH (ref 0–19)
Troponin T High Sensitivity: 34 ng/L — ABNORMAL HIGH (ref 0–19)
Troponin T High Sensitivity: 33 ng/L — ABNORMAL HIGH (ref 0–19)

## 2024-06-15 LAB — PRO BRAIN NATRIURETIC PEPTIDE: Pro Brain Natriuretic Peptide: <50 pg/mL

## 2024-06-15 MED ORDER — FUROSEMIDE 20 MG PO TABS
40.0000 mg | ORAL_TABLET | Freq: Once | ORAL | Status: AC
  Administered 2024-06-15: 40 mg via ORAL
  Filled 2024-06-15: qty 2

## 2024-06-15 MED ORDER — IPRATROPIUM-ALBUTEROL 0.5-2.5 (3) MG/3ML IN SOLN
3.0000 mL | Freq: Once | RESPIRATORY_TRACT | Status: AC
  Administered 2024-06-15: 3 mL via RESPIRATORY_TRACT
  Filled 2024-06-15: qty 3

## 2024-06-15 MED ORDER — DOXYCYCLINE HYCLATE 100 MG PO TABS
100.0000 mg | ORAL_TABLET | Freq: Once | ORAL | Status: AC
  Administered 2024-06-15: 100 mg via ORAL
  Filled 2024-06-15: qty 1

## 2024-06-15 MED ORDER — METHYLPREDNISOLONE SODIUM SUCC 125 MG IJ SOLR
125.0000 mg | Freq: Once | INTRAMUSCULAR | Status: AC
  Administered 2024-06-15: 125 mg via INTRAVENOUS
  Filled 2024-06-15: qty 2

## 2024-06-15 NOTE — ED Notes (Signed)
 CCMD contacted to place pt on cardiac monitoring
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
## 2024-06-16 ENCOUNTER — Encounter (HOSPITAL_COMMUNITY): Payer: Self-pay | Admitting: Osteopathic Medicine

## 2024-06-16 ENCOUNTER — Observation Stay (HOSPITAL_COMMUNITY)

## 2024-06-16 ENCOUNTER — Other Ambulatory Visit: Payer: Self-pay

## 2024-06-16 DIAGNOSIS — I5030 Unspecified diastolic (congestive) heart failure: Secondary | ICD-10-CM | POA: Diagnosis present

## 2024-06-16 DIAGNOSIS — I509 Heart failure, unspecified: Secondary | ICD-10-CM | POA: Diagnosis not present

## 2024-06-16 DIAGNOSIS — I5033 Acute on chronic diastolic (congestive) heart failure: Secondary | ICD-10-CM

## 2024-06-16 LAB — ECHOCARDIOGRAM COMPLETE
AR max vel: 2.53 cm2
AV Area VTI: 3.1 cm2
AV Area mean vel: 2.45 cm2
AV Mean grad: 6 mmHg
AV Peak grad: 10.4 mmHg
Ao pk vel: 1.61 m/s
Area-P 1/2: 3.65 cm2
Height: 65 in
S' Lateral: 3.5 cm
Weight: 4571.46 [oz_av]

## 2024-06-16 LAB — BASIC METABOLIC PANEL WITH GFR
Anion gap: 11 (ref 5–15)
Anion gap: 8 (ref 5–15)
BUN: 13 mg/dL (ref 6–20)
BUN: 22 mg/dL — ABNORMAL HIGH (ref 6–20)
CO2: 27 mmol/L (ref 22–32)
CO2: 32 mmol/L (ref 22–32)
Calcium: 9.2 mg/dL (ref 8.9–10.3)
Calcium: 8.5 mg/dL — ABNORMAL LOW (ref 8.9–10.3)
Chloride: 100 mmol/L (ref 98–111)
Chloride: 99 mmol/L (ref 98–111)
Creatinine, Ser: 1.15 mg/dL (ref 0.61–1.24)
Creatinine, Ser: 1.46 mg/dL — ABNORMAL HIGH (ref 0.61–1.24)
GFR, Estimated: >60 mL/min
GFR, Estimated: 58 mL/min — ABNORMAL LOW
Glucose, Bld: 130 mg/dL — ABNORMAL HIGH (ref 70–99)
Glucose, Bld: 124 mg/dL — ABNORMAL HIGH (ref 70–99)
Potassium: 4.1 mmol/L (ref 3.5–5.1)
Potassium: 4.2 mmol/L (ref 3.5–5.1)
Sodium: 138 mmol/L (ref 135–145)
Sodium: 139 mmol/L (ref 135–145)

## 2024-06-16 LAB — HIV ANTIBODY (ROUTINE TESTING W REFLEX): HIV Screen 4th Generation wRfx: NONREACTIVE

## 2024-06-16 LAB — TSH: TSH: 0.906 u[IU]/mL (ref 0.350–4.500)

## 2024-06-16 LAB — MAGNESIUM: Magnesium: 2.3 mg/dL (ref 1.7–2.4)

## 2024-06-16 MED ORDER — SODIUM CHLORIDE 0.9% FLUSH: 3.0000 mL | INTRAVENOUS | Status: DC | PRN

## 2024-06-16 MED ORDER — SODIUM CHLORIDE 0.9 % IV SOLN: 250.0000 mL | INTRAVENOUS | Status: AC | PRN

## 2024-06-16 MED ORDER — METOPROLOL SUCCINATE ER 100 MG PO TB24
200.0000 mg | ORAL_TABLET | Freq: Every day | ORAL | Status: DC
  Filled 2024-06-16: qty 2

## 2024-06-16 MED ORDER — ONDANSETRON HCL 4 MG/2ML IJ SOLN: 4.0000 mg | Freq: Four times a day (QID) | INTRAMUSCULAR | Status: DC | PRN

## 2024-06-16 MED ORDER — ACETAMINOPHEN 325 MG PO TABS: 650.0000 mg | ORAL_TABLET | ORAL | Status: DC | PRN

## 2024-06-16 MED ORDER — INFLUENZA VIRUS VACC SPLIT PF (FLUZONE) 0.5 ML IM SUSY: 0.5000 mL | PREFILLED_SYRINGE | INTRAMUSCULAR | Status: DC

## 2024-06-16 MED ORDER — FUROSEMIDE 10 MG/ML IJ SOLN
40.0000 mg | Freq: Two times a day (BID) | INTRAMUSCULAR | Status: AC
  Administered 2024-06-16: 40 mg via INTRAVENOUS
  Filled 2024-06-16: qty 4

## 2024-06-16 MED ORDER — SPIRONOLACTONE 25 MG PO TABS
25.0000 mg | ORAL_TABLET | Freq: Every day | ORAL | Status: DC
  Administered 2024-06-16 – 2024-06-17 (×2): 25 mg via ORAL
  Filled 2024-06-16 (×2): qty 1

## 2024-06-16 MED ORDER — SODIUM CHLORIDE 0.9% FLUSH
3.0000 mL | Freq: Two times a day (BID) | INTRAVENOUS | Status: DC
  Administered 2024-06-16 – 2024-06-17 (×3): 3 mL via INTRAVENOUS

## 2024-06-16 MED ORDER — PERFLUTREN LIPID MICROSPHERE
1.0000 mL | INTRAVENOUS | Status: AC | PRN
  Administered 2024-06-16: 3 mL via INTRAVENOUS

## 2024-06-16 MED ORDER — METOPROLOL SUCCINATE ER 50 MG PO TB24
50.0000 mg | ORAL_TABLET | Freq: Every day | ORAL | Status: DC
  Administered 2024-06-16: 50 mg via ORAL
  Filled 2024-06-16: qty 2

## 2024-06-16 MED ORDER — LOSARTAN POTASSIUM 50 MG PO TABS
50.0000 mg | ORAL_TABLET | Freq: Every day | ORAL | Status: DC
  Administered 2024-06-16 – 2024-06-17 (×2): 50 mg via ORAL
  Filled 2024-06-16 (×2): qty 1

## 2024-06-16 MED ORDER — ATORVASTATIN CALCIUM 10 MG PO TABS
10.0000 mg | ORAL_TABLET | Freq: Every day | ORAL | Status: DC
  Administered 2024-06-16 – 2024-06-17 (×2): 10 mg via ORAL
  Filled 2024-06-16 (×2): qty 1

## 2024-06-16 MED ORDER — PNEUMOCOCCAL 20-VAL CONJ VACC 0.5 ML IM SUSY
0.5000 mL | PREFILLED_SYRINGE | INTRAMUSCULAR | Status: DC
  Filled 2024-06-16: qty 0.5

## 2024-06-16 MED ORDER — ENOXAPARIN SODIUM 40 MG/0.4ML IJ SOSY
40.0000 mg | PREFILLED_SYRINGE | INTRAMUSCULAR | Status: DC
  Administered 2024-06-16: 40 mg via SUBCUTANEOUS
  Filled 2024-06-16 (×2): qty 0.4

## 2024-06-16 MED ORDER — FUROSEMIDE 10 MG/ML IJ SOLN
40.0000 mg | Freq: Two times a day (BID) | INTRAMUSCULAR | Status: DC
  Administered 2024-06-16: 40 mg via INTRAVENOUS
  Filled 2024-06-16: qty 4
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
## 2024-06-16 NOTE — Plan of Care (Signed)
   Problem: Clinical Measurements: Goal: Ability to maintain clinical measurements within normal limits will improve Outcome: Progressing
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
## 2024-06-16 NOTE — ED Provider Notes (Signed)
 I assumed care at signout to call report. Patient is awake and alert in no acute distress.  However he is still on oxygen. Patient will be admitted for further evaluation.  History not consistent with pulmonary embolism. Patient agreeable with plan.  Discussed with Dr. Marsa for admission   Midge Golas, MD 06/16/24 [phone]

## 2024-06-16 NOTE — Plan of Care (Signed)
 Patient is noted to have frequent pauses initially was 2.7 seconds pause and had another ONE at 2.7 and followed by 4.7 and patient also became bradycardic in the 20s.  When patient wakes up his heart rate goes back into 80s normal sinus rhythm.  Blood pressure systolic is 120/85.  Stat EKG shows normal sinus rhythm.  I reviewed patient's labs notes and medications.  I discussed with cardiology Dr. Lea.  Will discontinue patient's Toprol -XL.  Last dose given this morning.  Since there is some concern for undiagnosed sleep apnea/obesity hypoventilation will check ABG keep patient on BiPAP and basic labs including metabolic panel, magnesium and TSH have been ordered.  Will closely monitor.  Cardiology to see patient in consult.  Redia Cleaver MD.
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
## 2024-06-17 ENCOUNTER — Other Ambulatory Visit (HOSPITAL_COMMUNITY): Payer: Self-pay

## 2024-06-17 DIAGNOSIS — I455 Other specified heart block: Secondary | ICD-10-CM

## 2024-06-17 LAB — BASIC METABOLIC PANEL WITH GFR
Anion gap: 10 (ref 5–15)
BUN: 22 mg/dL — ABNORMAL HIGH (ref 6–20)
CO2: 30 mmol/L (ref 22–32)
Calcium: 8.7 mg/dL — ABNORMAL LOW (ref 8.9–10.3)
Chloride: 99 mmol/L (ref 98–111)
Creatinine, Ser: 1.28 mg/dL — ABNORMAL HIGH (ref 0.61–1.24)
GFR, Estimated: >60 mL/min
Glucose, Bld: 105 mg/dL — ABNORMAL HIGH (ref 70–99)
Potassium: 3.9 mmol/L (ref 3.5–5.1)
Sodium: 140 mmol/L (ref 135–145)

## 2024-06-17 MED ORDER — METOPROLOL SUCCINATE ER 100 MG PO TB24
100.0000 mg | ORAL_TABLET | Freq: Every day | ORAL | 0 refills | Status: AC
  Filled 2024-06-17: qty 30, 30d supply, fill #0

## 2024-06-17 MED ORDER — ENOXAPARIN SODIUM 60 MG/0.6ML IJ SOSY
60.0000 mg | PREFILLED_SYRINGE | Freq: Every day | INTRAMUSCULAR | Status: DC
  Administered 2024-06-17: 60 mg via SUBCUTANEOUS
  Filled 2024-06-17: qty 0.6

## 2024-06-17 MED ORDER — METOPROLOL SUCCINATE ER 100 MG PO TB24: 100.0000 mg | ORAL_TABLET | Freq: Every day | ORAL | Status: DC

## 2024-06-17 MED ORDER — SPIRONOLACTONE 25 MG PO TABS
25.0000 mg | ORAL_TABLET | Freq: Every day | ORAL | 0 refills | Status: AC
  Filled 2024-06-17: qty 30, 30d supply, fill #0

## 2024-06-17 MED ORDER — FUROSEMIDE 40 MG PO TABS: 40.0000 mg | ORAL_TABLET | Freq: Every day | ORAL | Status: AC

## 2024-06-17 MED ORDER — LOSARTAN POTASSIUM 50 MG PO TABS
50.0000 mg | ORAL_TABLET | Freq: Every day | ORAL | 0 refills | Status: AC
  Filled 2024-06-17: qty 30, 30d supply, fill #0

## 2024-06-17 NOTE — Progress Notes (Signed)

## 2024-06-17 NOTE — TOC Transition Note (Addendum)
 Transition of Care [facility]) - Discharge Note   Patient Details  Name: Michael Ross MRN: [id number] Date of Birth: 1971/01/04  [facility] CM/SW Contact:  Waddell Barnie Rama, RN Phone Number: 06/17/2024, 10:36 AM   Clinical Narrative:    For dc today, he will call his aunt for transportation,  he has no PCP, will get a follow up apt for him. The closest new patient apt was 3/11 ,scheduled on AVS.         Patient Goals and CMS Choice            Discharge Placement                       Discharge Plan and Services Additional resources added to the After Visit Summary for                                       Social Drivers of Health (SDOH) Interventions SDOH Screenings   Food Insecurity: No Food Insecurity (06/16/2024)  Housing: Low Risk (06/16/2024)  Transportation Needs: No Transportation Needs (06/16/2024)  Utilities: Not At Risk (06/16/2024)  Tobacco Use: High Risk (06/16/2024)     Readmission Risk Interventions     No data to display

## 2024-06-17 NOTE — Discharge Summary (Incomplete)
 Physician Discharge Summary  [place] FMW:[id number] DOB: 06/24/1970 DOA: 06/15/2024  PCP: Pcp, No  Admit date: 06/15/2024 Discharge date: 06/17/2024  Time spent: 45 minutes  Recommendations for Outpatient Follow-up:  PCP in 1 week Needs outpatient sleep study Cardiology at [facility] in 2 weeks   Discharge Diagnoses:  Hypoxia Suspected OSA OHS Bradycardia Acute on chronic diastolic CHF   Discharge Condition: ***  Diet recommendation: ***  Filed Weights   06/16/24 0100 06/16/24 1216 06/17/24 0421  Weight: 117.9 kg 129.6 kg 129.2 kg    History of present illness:  ***  Hospital Course:    Discharge Exam: Vitals:   06/17/24 0421 06/17/24 0742  BP: 108/72 121/87  Pulse: 68 78  Resp: 17 (!) 21  Temp: 98.3 F (36.8 C) 98.4 F (36.9 C)  SpO2: 97% 90%   Gen: Awake, Alert, Oriented X 3,  HEENT: no JVD Lungs: Good air movement bilaterally, CTAB CVS: S1S2/RRR Abd: soft, Non tender, non distended, BS present Extremities: No edema Skin: no new rashes on exposed skin   Discharge Instructions   Discharge Instructions     Diet - low sodium heart healthy   Complete by: As directed    Increase activity slowly   Complete by: As directed       Allergies as of 06/17/2024       Reactions   Ibuprofen    Was told not to take NSAIDS   Lisinopril Swelling   angioedema        Medication List     STOP taking these medications    azithromycin  250 MG tablet Commonly known as: ZITHROMAX    benzonatate  100 MG capsule Commonly known as: TESSALON    chlorpheniramine-HYDROcodone  10-8 MG/5ML Commonly known as: TUSSIONEX   HYDROcodone -Acetaminophen  5-300 MG Tabs   methylPREDNISolone  4 MG Tbpk tablet Commonly known as: MEDROL  DOSEPAK   promethazine -dextromethorphan 6.25-15 MG/5ML syrup Commonly known as: PROMETHAZINE -DM       TAKE these medications    atorvastatin  10 MG tablet Commonly known as: LIPITOR Take 1 tablet (10 mg total) by mouth  daily.   furosemide  40 MG tablet Commonly known as: LASIX  Take 1 tablet (40 mg total) by mouth daily. What changed: when to take this   losartan  50 MG tablet Commonly known as: COZAAR  Take 1 tablet (50 mg total) by mouth daily. What changed:  medication strength how much to take   metoprolol  succinate 100 MG 24 hr tablet Commonly known as: TOPROL -XL Take 1 tablet (100 mg total) by mouth daily. What changed:  medication strength how much to take   sildenafil 100 MG tablet Commonly known as: VIAGRA Take 100 mg by mouth daily as needed for erectile dysfunction.   spironolactone  25 MG tablet Commonly known as: ALDACTONE  Take 1 tablet (25 mg total) by mouth daily. Start taking on: June 18, 2024       Allergies[1]    The results of significant diagnostics from this hospitalization (including imaging, microbiology, ancillary and laboratory) are listed below for reference.    Significant Diagnostic Studies: ECHOCARDIOGRAM COMPLETE Result Date: 06/16/2024    ECHOCARDIOGRAM REPORT   Patient Name:   MEKHI SONN Date of Exam: 06/16/2024 Medical Rec #:  [id number]       Height:       65.0 in Accession #:    [id number]      Weight:       285.7 lb Date of Birth:  1970/12/22       BSA:  2.302 m Patient Age:    54 years        BP:           111/76 mmHg Patient Gender: M               HR:           82 bpm. Exam Location:  Inpatient Procedure: 2D Echo, Cardiac Doppler, Color Doppler and Intracardiac            Opacification Agent (Both Spectral and Color Flow Doppler were            utilized during procedure). Indications:    CHF  History:        Patient has no prior history of Echocardiogram examinations.                 CHF; COPD.  Sonographer:    Philomena Daring Referring Phys: [id number] Emony Dormer IMPRESSIONS  1. Left ventricular ejection fraction, by estimation, is 60 to 65%. The left ventricle has normal function. The left ventricle has no regional wall motion abnormalities. Left  ventricular diastolic parameters were normal.  2. Right ventricular systolic function is normal. The right ventricular size is normal. Tricuspid regurgitation signal is inadequate for assessing PA pressure.  3. There is no evidence of pericardial effusion.  4. The mitral valve is grossly normal. No evidence of mitral valve regurgitation. No evidence of mitral stenosis.  5. The aortic valve is tricuspid. Aortic valve regurgitation is not visualized. No aortic stenosis is present. Aortic valve mean gradient measures 6.0 mmHg.  6. The inferior vena cava is normal in size with <50% respiratory variability, suggesting right atrial pressure of 8 mmHg. Comparison(s): No prior Echocardiogram. FINDINGS  Left Ventricle: Left ventricular ejection fraction, by estimation, is 60 to 65%. The left ventricle has normal function. The left ventricle has no regional wall motion abnormalities. The left ventricular internal cavity size was normal in size. There is  borderline left ventricular hypertrophy. Left ventricular diastolic parameters were normal. Right Ventricle: The right ventricular size is normal. No increase in right ventricular wall thickness. Right ventricular systolic function is normal. Tricuspid regurgitation signal is inadequate for assessing PA pressure. Left Atrium: Left atrial size was normal in size. Right Atrium: Right atrial size was normal in size. Pericardium: There is no evidence of pericardial effusion. Presence of epicardial fat layer. Mitral Valve: The mitral valve is grossly normal. No evidence of mitral valve regurgitation. No evidence of mitral valve stenosis. Tricuspid Valve: The tricuspid valve is grossly normal. Tricuspid valve regurgitation is trivial. No evidence of tricuspid stenosis. Aortic Valve: The aortic valve is tricuspid. There is mild aortic valve annular calcification. Aortic valve regurgitation is not visualized. No aortic stenosis is present. Aortic valve mean gradient measures 6.0  mmHg. Aortic valve peak gradient measures 10.4 mmHg. Aortic valve area, by VTI measures 3.10 cm. Pulmonic Valve: The pulmonic valve was not well visualized. Pulmonic valve regurgitation is trivial. No evidence of pulmonic stenosis. Aorta: The aortic root and ascending aorta are structurally normal, with no evidence of dilitation. Venous: The inferior vena cava is normal in size with less than 50% respiratory variability, suggesting right atrial pressure of 8 mmHg. IAS/Shunts: No atrial level shunt detected by color flow Doppler. Additional Comments: 3D was performed not requiring image post processing on an independent workstation and was indeterminate.  LEFT VENTRICLE PLAX 2D LVIDd:         5.30 cm   Diastology LVIDs:  3.50 cm   LV e' medial:    9.03 cm/s LV PW:         1.00 cm   LV E/e' medial:  12.6 LV IVS:        1.10 cm   LV e' lateral:   13.30 cm/s LVOT diam:     2.19 cm   LV E/e' lateral: 8.6 LV SV:         80 LV SV Index:   35 LVOT Area:     3.77 cm  RIGHT VENTRICLE             IVC RV Basal diam:  2.84 cm     IVC diam: 2.18 cm RV S prime:     12.80 cm/s TAPSE (M-mode): 2.2 cm LEFT ATRIUM             Index        RIGHT ATRIUM           Index LA diam:        3.99 cm 1.73 cm/m   RA Area:     11.00 cm LA Vol (A2C):   33.8 ml 14.68 ml/m  RA Volume:   22.80 ml  9.90 ml/m LA Vol (A4C):   33.5 ml 14.55 ml/m LA Biplane Vol: 34.0 ml 14.77 ml/m  AORTIC VALVE AV Area (Vmax):    2.53 cm AV Area (Vmean):   2.45 cm AV Area (VTI):     3.10 cm AV Vmax:           161.00 cm/s AV Vmean:          111.000 cm/s AV VTI:            0.258 m AV Peak Grad:      10.4 mmHg AV Mean Grad:      6.0 mmHg LVOT Vmax:         108.00 cm/s LVOT Vmean:        72.300 cm/s LVOT VTI:          0.212 m LVOT/AV VTI ratio: 0.82  AORTA Ao Root diam: 3.39 cm Ao Asc diam:  3.13 cm MITRAL VALVE MV Area (PHT): 3.65 cm     SHUNTS MV Decel Time: 208 msec     Systemic VTI:  0.21 m MV E velocity: 114.00 cm/s  Systemic Diam: 2.19 cm MV A  velocity: 107.00 cm/s MV E/A ratio:  1.07 Jayson Sierras MD Electronically signed by Jayson Sierras MD Signature Date/Time: 06/16/2024/3:20:47 PM    Final    DG Chest Portable 1 View Result Date: 06/15/2024 EXAM: 1 VIEW(S) XRAY OF THE CHEST 06/15/2024 08:42:56 PM COMPARISON: Chest x ray 05/24/2024. CLINICAL HISTORY: Cough/CHF Cough; congestive heart failure. FINDINGS: LUNGS AND PLEURA: Lung volumes are low. No focal pulmonary opacity. No pleural effusion. No pneumothorax. HEART AND MEDIASTINUM: No acute abnormality of the cardiac and mediastinal silhouettes. BONES AND SOFT TISSUES: No acute osseous abnormality. IMPRESSION: 1. No acute findings. Electronically signed by: Greig Pique MD 06/15/2024 08:47 PM EST RP Workstation: [id number]   CT Chest Wo Contrast Result Date: 05/24/2024 EXAM: CT CHEST WITHOUT CONTRAST 05/24/2024 01:54:27 AM TECHNIQUE: CT of the chest was performed without the administration of intravenous contrast. Multiplanar reformatted images are provided for review. Automated exposure control, iterative reconstruction, and/or weight based adjustment of the mA/kV was utilized to reduce the radiation dose to as low as reasonably achievable. COMPARISON: Chest x-ray earlier today. CLINICAL HISTORY: Respiratory illness, nondiagnostic xray. FINDINGS: MEDIASTINUM: Heart and pericardium are unremarkable.  The central airways are clear. LYMPH NODES: No mediastinal, hilar or axillary lymphadenopathy. LUNGS AND PLEURA: No focal consolidation or pulmonary edema. No pleural effusion or pneumothorax. SOFT TISSUES/BONES: No acute abnormality of the bones or soft tissues. UPPER ABDOMEN: Limited images of the upper abdomen demonstrates no acute abnormality. IMPRESSION: 1. No acute abnormality. Electronically signed by: Franky Crease MD MD 05/24/2024 02:03 AM EST RP Workstation: [id number]   DG Chest 2 View Result Date: 05/24/2024 EXAM: 2 VIEW(S) XRAY OF THE CHEST 05/24/2024 12:25:09 AM COMPARISON: 06/02/2023  CLINICAL HISTORY: SOB FINDINGS: LUNGS AND PLEURA: No focal pulmonary opacity. No pleural effusion. No pneumothorax. HEART AND MEDIASTINUM: No acute abnormality of the cardiac and mediastinal silhouettes. BONES AND SOFT TISSUES: Thoracic degenerative changes. IMPRESSION: 1. No acute cardiopulmonary process. Electronically signed by: Pinkie Pebbles MD MD 05/24/2024 12:27 AM EST RP Workstation: [id number]    Microbiology: Recent Results (from the past 240 hours)  Resp panel by RT-PCR (RSV, Flu A&B, Covid) Anterior Nasal Swab     Status: None   Collection Time: 06/15/24  9:28 PM   Specimen: Anterior Nasal Swab  Result Value Ref Range Status   SARS Coronavirus 2 by RT PCR NEGATIVE NEGATIVE Final   Influenza A by PCR NEGATIVE NEGATIVE Final   Influenza B by PCR NEGATIVE NEGATIVE Final    Comment: (NOTE) The Xpert Xpress SARS-CoV-2/FLU/RSV plus assay is intended as an aid in the diagnosis of influenza from Nasopharyngeal swab specimens and should not be used as a sole basis for treatment. Nasal washings and aspirates are unacceptable for Xpert Xpress SARS-CoV-2/FLU/RSV testing.  Fact Sheet for Patients: bloggercourse.com  Fact Sheet for Healthcare Providers: seriousbroker.it  This test is not yet approved or cleared by the [place]  FDA and has been authorized for detection and/or diagnosis of SARS-CoV-2 by FDA under an Emergency Use Authorization (EUA). This EUA will remain in effect (meaning this test can be used) for the duration of the COVID-19 declaration under Section 564(b)(1) of the Act, 21 U.S.C. section 360bbb-3(b)(1), unless the authorization is terminated or revoked.     Resp Syncytial Virus by PCR NEGATIVE NEGATIVE Final    Comment: (NOTE) Fact Sheet for Patients: bloggercourse.com  Fact Sheet for Healthcare Providers: seriousbroker.it  This test is not yet approved  or cleared by the [place]  FDA and has been authorized for detection and/or diagnosis of SARS-CoV-2 by FDA under an Emergency Use Authorization (EUA). This EUA will remain in effect (meaning this test can be used) for the duration of the COVID-19 declaration under Section 564(b)(1) of the Act, 21 U.S.C. section 360bbb-3(b)(1), unless the authorization is terminated or revoked.  Performed at [facility], [address] [place], [place] [address]      Labs: Basic Metabolic Panel: Recent Labs  Lab 06/15/24 2030 06/16/24 0519 06/16/24 2054 06/17/24 0132  NA 137 138 139 140  K 3.8 4.1 4.2 3.9  CL 98 100 99 99  CO2 30 27 32 30  GLUCOSE 97 130* 124* 105*  BUN 15 13 22* 22*  CREATININE 1.21 1.15 1.46* 1.28*  CALCIUM  9.2 9.2 8.5* 8.7*  MG  --   --  2.3  --    Liver Function Tests: Recent Labs  Lab 06/15/24 2030  AST 28  ALT 25  ALKPHOS 93  BILITOT 0.6  PROT 8.2*  ALBUMIN 3.8   No results for input(s): LIPASE, AMYLASE in the last 168 hours. No results for input(s): AMMONIA in the last 168 hours. CBC: Recent Labs  Lab 06/15/24 2030  WBC 15.0*  NEUTROABS 11.5*  HGB 15.1  HCT 47.3  MCV 89.1  PLT 296   Cardiac Enzymes: No results for input(s): CKTOTAL, CKMB, CKMBINDEX, TROPONINI in the last 168 hours. BNP: BNP (last 3 results) No results for input(s): BNP in the last 8760 hours.  ProBNP (last 3 results) Recent Labs    05/24/24 0038 06/15/24 2030  PROBNP 51.4 <50.0    CBG: No results for input(s): GLUCAP in the last 168 hours.     Signed:  Sigurd Pac MD.  [facility] 06/17/2024, 9:56 AM        [1]  Allergies Allergen Reactions   Ibuprofen     Was told not to take NSAIDS   Lisinopril Swelling    angioedema

## 2024-06-17 NOTE — Plan of Care (Signed)

## 2024-06-17 NOTE — TOC CM/SW Note (Signed)
 Transition of Care [facility]) - Inpatient Brief Assessment   Patient Details  Name: Michael Ross MRN: [id number] Date of Birth: March 09, 1971  Transition of [facility] CM/SW Contact:    Waddell Barnie Rama, RN Phone Number: 06/17/2024, 10:21 AM   Clinical Narrative: From home with spouse, has no  PCP and has insurance on file, states has no HH services in place at this time or DME at home.  States family member  (aunt) will transport them home at [employer] and family is support system, states gets medications from [place] [facility] [place], ok with [facility] pharmacy filling meds.  Pta self ambulatory.        Transition of Care Asessment: Insurance and Status: Insurance coverage has been reviewed Patient has primary care physician: No Home environment has been reviewed: home with aunt Prior level of function:: indep Prior/Current Home Services: No current home services Social Drivers of Health Review: SDOH reviewed no interventions necessary Readmission risk has been reviewed: Yes Transition of care needs: transition of care needs identified, TOC will continue to follow

## 2024-07-24 ENCOUNTER — Ambulatory Visit (INDEPENDENT_AMBULATORY_CARE_PROVIDER_SITE_OTHER): Payer: Self-pay | Admitting: Primary Care
